# Patient Record
Sex: Female | Born: 2000 | Race: White | Hispanic: No | Marital: Single | Smoking: Never smoker
Health system: Southern US, Community
[De-identification: ages and names within clinical notes are randomized; demographics above are authoritative.]

## PROBLEM LIST (undated history)

## (undated) DIAGNOSIS — Z789 Other specified health status: Secondary | ICD-10-CM

## (undated) HISTORY — DX: Other specified health status: Z78.9

---
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
## 2019-12-13 ENCOUNTER — Encounter (HOSPITAL_COMMUNITY): Payer: Self-pay | Admitting: Emergency Medicine

## 2019-12-13 ENCOUNTER — Other Ambulatory Visit: Payer: Self-pay

## 2019-12-13 ENCOUNTER — Emergency Department (HOSPITAL_COMMUNITY)
Admission: EM | Admit: 2019-12-13 | Discharge: 2019-12-13 | Disposition: A | Discharge status: Home / Self Care | Payer: Self-pay | Attending: Emergency Medicine | Admitting: Emergency Medicine

## 2019-12-13 DIAGNOSIS — Y92410 Unspecified street and highway as the place of occurrence of the external cause: Secondary | ICD-10-CM | POA: Insufficient documentation

## 2019-12-13 DIAGNOSIS — Y999 Unspecified external cause status: Secondary | ICD-10-CM | POA: Insufficient documentation

## 2019-12-13 DIAGNOSIS — M791 Myalgia, unspecified site: Secondary | ICD-10-CM | POA: Insufficient documentation

## 2019-12-13 DIAGNOSIS — M79602 Pain in left arm: Secondary | ICD-10-CM | POA: Insufficient documentation

## 2019-12-13 DIAGNOSIS — Y93I9 Activity, other involving external motion: Secondary | ICD-10-CM | POA: Insufficient documentation

## 2019-12-13 MED ORDER — METHOCARBAMOL 500 MG PO TABS
500.0000 mg | ORAL_TABLET | Freq: Two times a day (BID) | ORAL | 0 refills | Status: DC
Start: 2019-12-13 — End: 2020-03-24
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
## 2019-12-13 NOTE — Discharge Instructions (Signed)

## 2019-12-13 NOTE — ED Notes (Signed)
Discussed discharge instructions and medications with patient.  All questions addressed. Patient transported home via car by her friend.
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
## 2020-03-24 ENCOUNTER — Ambulatory Visit (INDEPENDENT_AMBULATORY_CARE_PROVIDER_SITE_OTHER): Payer: Self-pay | Admitting: *Deleted

## 2020-03-24 ENCOUNTER — Other Ambulatory Visit: Payer: Self-pay

## 2020-03-24 VITALS — BP 120/74 | HR 79 | Temp 98.8°F | Ht 63.0 in | Wt 152.8 lb

## 2020-03-24 DIAGNOSIS — Z3400 Encounter for supervision of normal first pregnancy, unspecified trimester: Secondary | ICD-10-CM | POA: Insufficient documentation

## 2020-03-24 LAB — POCT URINE PREGNANCY: Preg Test, Ur: POSITIVE — AB

## 2020-03-24 NOTE — Progress Notes (Signed)
   PRENATAL INTAKE SUMMARY  Ms. Olheiser presents today New OB Nurse Interview.  OB History    Gravida  1   Para      Term      Preterm      AB      Living        SAB      TAB      Ectopic      Multiple      Live Births             I have reviewed the patient's medical, obstetrical, social, and family histories, medications, and available lab results.  SUBJECTIVE She has no unusual complaints.  OBJECTIVE Initial nurse interview for history (New OB)  EDD: 10/25/20 by LMP GA:[redacted]w[redacted]d G1P0  GENERAL APPEARANCE: alert, well appearing, in no apparent distress, oriented to person, place and time.  ASSESSMENT Positive Pregnancy test Normal pregnancy  PLAN Prenatal care-[facility] to be completed at next visit 04/09/20 with Steward Drone, CNM Continue PNV Patient to sign up for Bab[employer]s Patient to purchase Blood pressure monitor. Offered patient to speak with Behavioral Health Advisor due to PHQ-9 score: 7. Declined services today. Advised patient to call clinic or send Mychart message if she change her mind.  Clovis Pu, RN

## 2020-03-24 NOTE — Patient Instructions (Addendum)
First Trimester of Pregnancy  The first trimester of pregnancy is from week 1 until the end of week 13 (months 1 through 3). During this time, your baby will begin to develop inside you. At 6-8 weeks, the eyes and face are formed, and the heartbeat can be seen on ultrasound. At the end of 12 weeks, all the baby's organs are formed. Prenatal care is all the medical care you receive before the birth of your baby. Make sure you get good prenatal care and follow all of your doctor's instructions. Follow these instructions at home: Medicines  Take over-the-counter and prescription medicines only as told by your doctor. Some medicines are safe and some medicines are not safe during pregnancy.  Take a prenatal vitamin that contains at least 600 micrograms (mcg) of folic acid.  If you have trouble pooping (constipation), take medicine that will make your stool soft (stool softener) if your doctor approves. Eating and drinking   Eat regular, healthy meals.  Your doctor will tell you the amount of weight gain that is right for you.  Avoid raw meat and uncooked cheese.  If you feel sick to your stomach (nauseous) or throw up (vomit): ? Eat 4 or 5 small meals a day instead of 3 large meals. ? Try eating a few soda crackers. ? Drink liquids between meals instead of during meals.  To prevent constipation: ? Eat foods that are high in fiber, like fresh fruits and vegetables, whole grains, and beans. ? Drink enough fluids to keep your pee (urine) clear or pale yellow. Activity  Exercise only as told by your doctor. Stop exercising if you have cramps or pain in your lower belly (abdomen) or low back.  Do not exercise if it is too hot, too humid, or if you are in a place of great height (high altitude).  Try to avoid standing for long periods of time. Move your legs often if you must stand in one place for a long time.  Avoid heavy lifting.  Wear low-heeled shoes. Sit and stand up  straight.  You can have sex unless your doctor tells you not to. Relieving pain and discomfort  Wear a good support bra if your breasts are sore.  Take warm water baths (sitz baths) to soothe pain or discomfort caused by hemorrhoids. Use hemorrhoid cream if your doctor says it is okay.  Rest with your legs raised if you have leg cramps or low back pain.  If you have puffy, bulging veins (varicose veins) in your legs: ? Wear support hose or compression stockings as told by your doctor. ? Raise (elevate) your feet for 15 minutes, 3-4 times a day. ? Limit salt in your food. Prenatal care  Schedule your prenatal visits by the twelfth week of pregnancy.  Write down your questions. Take them to your prenatal visits.  Keep all your prenatal visits as told by your doctor. This is important. Safety  Wear your seat belt at all times when driving.  Make a list of emergency phone numbers. The list should include numbers for family, friends, the hospital, and police and fire departments. General instructions  Ask your doctor for a referral to a local prenatal class. Begin classes no later than at the start of month 6 of your pregnancy.  Ask for help if you need counseling or if you need help with nutrition. Your doctor can give you advice or tell you where to go for help.  Do not use hot tubs, steam   tubs, steam rooms, or saunas.  Do not douche or use tampons or scented sanitary pads.  Do not cross your legs for long periods of time.  Avoid all herbs and alcohol. Avoid drugs that are not approved by your doctor.  Do not use any tobacco products, including cigarettes, chewing tobacco, and electronic cigarettes. If you need help quitting, ask your doctor. You may get counseling or other support to help you quit.  Avoid cat litter boxes and soil used by cats. These carry germs that can cause birth defects in the baby and can cause a loss of your baby (miscarriage) or stillbirth.  Visit your dentist.  At home, brush your teeth with a soft toothbrush. Be gentle when you floss. Contact a doctor if:  You are dizzy.  You have mild cramps or pressure in your lower belly.  You have a nagging pain in your belly area.  You continue to feel sick to your stomach, you throw up, or you have watery poop (diarrhea).  You have a bad smelling fluid coming from your vagina.  You have pain when you pee (urinate).  You have increased puffiness (swelling) in your face, hands, legs, or ankles. Get help right away if:  You have a fever.  You are leaking fluid from your vagina.  You have spotting or bleeding from your vagina.  You have very bad belly cramping or pain.  You gain or lose weight rapidly.  You throw up blood. It may look like coffee grounds.  You are around people who have [place] measles, fifth disease, or chickenpox.  You have a very bad headache.  You have shortness of breath.  You have any kind of trauma, such as from a fall or a car accident. Summary  The first trimester of pregnancy is from week 1 until the end of week 13 (months 1 through 3).  To take care of yourself and your unborn baby, you will need to eat healthy meals, take medicines only if your doctor tells you to do so, and do activities that are safe for you and your baby.  Keep all follow-up visits as told by your doctor. This is important as your doctor will have to ensure that your baby is healthy and growing well. This information is not intended to replace advice given to you by your health care provider. Make sure you discuss any questions you have with your health care provider. Document Revised: 11/02/2018 Document Reviewed: 07/20/2016 Elsevier Patient Education  2020 [employer].  Warning Signs During Pregnancy A pregnancy lasts about 40 weeks, starting from the first day of your last period until the baby is born. Pregnancy is divided into three phases called trimesters.  The first trimester  refers to week 1 through week 13 of pregnancy.  The second trimester is the start of week 14 through the end of week 27.  The third trimester is the start of week 28 until you deliver your baby. During each trimester of pregnancy, certain signs and symptoms may indicate a problem. Talk with your health care provider about your current health and any medical conditions you have. Make sure you know the symptoms that you should watch for and report. How does this affect me?  Warning signs in the first trimester While some changes during the first trimester may be uncomfortable, most do not represent a serious problem. Let your health care provider know if you have any of the following warning signs in the first trimester:  You cannot eat or drink without vomiting, and this lasts for longer than a day.  You have vaginal bleeding or spotting along with menstrual-like cramping.  You have diarrhea for longer than a day.  You have a fever or other signs of infection, such as: ? Pain or burning when you urinate. ? Foul smelling or thick or yellowish vaginal discharge. Warning signs in the second trimester As your baby grows and changes during the second trimester, there are additional signs and symptoms that may indicate a problem. These include:  Signs and symptoms of infection, including a fever.  Signs or symptoms of a miscarriage or preterm labor, such as regular contractions, menstrual-like cramping, or lower abdominal pain.  Bloody or watery vaginal discharge or obvious vaginal bleeding.  Feeling like your heart is pounding.  Having trouble breathing.  Nausea, vomiting, or diarrhea that lasts for longer than a day.  Craving non-food items, such as clay, chalk, or dirt. This may be a sign of a very treatable medical condition called pica. Later in your second trimester, watch for signs and symptoms of a serious medical condition called preeclampsia.These include:  Changes in your  vision.  A severe headache that does not go away.  Nausea and vomiting. It is also important to notice if your baby stops moving or moves less than usual during this time. Warning signs in the third trimester As you approach the third trimester, your baby is growing and your body is preparing for the birth of your baby. In your third trimester, be sure to let your health care provider know if:  You have signs and symptoms of infection, including a fever.  You have vaginal bleeding.  You notice that your baby is moving less than usual or is not moving.  You have nausea, vomiting, or diarrhea that lasts for longer than a day.  You have a severe headache that does not go away.  You have vision changes, including seeing spots or having blurry or double vision.  You have increased swelling in your hands or face. How does this affect my baby? Throughout your pregnancy, always report any of the warning signs of a problem to your health care provider. This can help prevent complications that may affect your baby, including:  Increased risk for premature birth.  Infection that may be transmitted to your baby.  Increased risk for stillbirth. Contact a health care provider if:  You have any of the warning signs of a problem for the current trimester of your pregnancy.  Any of the following apply to you during any trimester of pregnancy: ? You have strong emotions, such as sadness or anxiety, that interfere with work or personal relationships. ? You feel unsafe in your home and need help finding a safe place to live. ? You are using tobacco products, alcohol, or drugs and you need help to stop. Get help right away if: You have signs or symptoms of labor before 37 weeks of pregnancy. These include:  Contractions that are 5 minutes or less apart, or that increase in frequency, intensity, or length.  Sudden, sharp abdominal pain or low back pain.  Uncontrolled gush or trickle of fluid  from your vagina. Summary  A pregnancy lasts about 40 weeks, starting from the first day of your last period until the baby is born. Pregnancy is divided into three phases called trimesters. Each trimester has warning signs to watch for.  Always report any warning signs to your health care provider in  order to prevent complications that may affect both you and your baby.  Talk with your health care provider about your current health and any medical conditions you have. Make sure you know the symptoms that you should watch for and report. This information is not intended to replace advice given to you by your health care provider. Make sure you discuss any questions you have with your health care provider. Document Revised: 10/31/2018 Document Reviewed: 04/28/2017 Elsevier Patient Education  [place].  How to Take Your Blood Pressure You can take your blood pressure at home with a machine. You may need to check your blood pressure at home:  To check if you have high blood pressure (hypertension).  To check your blood pressure over time.  To make sure your blood pressure medicine is working. Supplies needed: You will need a blood pressure machine, or monitor. You can buy one at a drugstore or online. When choosing one:  Choose one with an arm cuff.  Choose one that wraps around your upper arm. Only one finger should fit between your arm and the cuff.  Do not choose one that measures your blood pressure from your wrist or finger. Your doctor can suggest a monitor. How to prepare Avoid these things for 30 minutes before checking your blood pressure:  Drinking caffeine.  Drinking alcohol.  Eating.  Smoking.  Exercising. Five minutes before checking your blood pressure:  Pee.  Sit in a dining chair. Avoid sitting in a soft couch or armchair.  Be quiet. Do not talk. How to take your blood pressure Follow the instructions that came with your machine. If you have a  digital blood pressure monitor, these may be the instructions: 1. Sit up straight. 2. Place your feet on the floor. Do not cross your ankles or legs. 3. Rest your left arm at the level of your heart. You may rest it on a table, desk, or chair. 4. Pull up your shirt sleeve. 5. Wrap the blood pressure cuff around the upper part of your left arm. The cuff should be 1 inch (2.5 cm) above your elbow. It is best to wrap the cuff around bare skin. 6. Fit the cuff snugly around your arm. You should be able to place only one finger between the cuff and your arm. 7. Put the cord inside the groove of your elbow. 8. Press the power button. 9. Sit quietly while the cuff fills with air and loses air. 10. Write down the numbers on the screen. 11. Wait 2-3 minutes and then repeat steps 1-10. What do the numbers mean? Two numbers make up your blood pressure. The first number is called systolic pressure. The second is called diastolic pressure. An example of a blood pressure reading is "120 over 80" (or 120/80). If you are an adult and do not have a medical condition, use this guide to find out if your blood pressure is normal: Normal  First number: below 120.  Second number: below 80. Elevated  First number: 120-129.  Second number: below 80. Hypertension stage 1  First number: 130-139.  Second number: 80-89. Hypertension stage 2  First number: 140 or above.  Second number: 68 or above. Your blood pressure is above normal even if only the top or bottom number is above normal. Follow these instructions at home:  Check your blood pressure as often as your doctor tells you to.  Take your monitor to your next doctor's appointment. Your doctor will: ? Make sure  you are using it correctly. ? Make sure it is working right.  Make sure you understand what your blood pressure numbers should be.  Tell your doctor if your medicines are causing side effects. Contact a doctor if:  Your blood  pressure keeps being high. Get help right away if:  Your first blood pressure number is higher than 180.  Your second blood pressure number is higher than 120. This information is not intended to replace advice given to you by your health care provider. Make sure you discuss any questions you have with your health care provider. Document Revised: 06/24/2017 Document Reviewed: 12/19/2015 Elsevier Patient Education  2020 [employer] Inc[employer]  

## 2020-04-09 ENCOUNTER — Ambulatory Visit (INDEPENDENT_AMBULATORY_CARE_PROVIDER_SITE_OTHER): Payer: Self-pay | Admitting: Certified Nurse Midwife

## 2020-04-09 ENCOUNTER — Encounter: Payer: Self-pay | Admitting: Certified Nurse Midwife

## 2020-04-09 ENCOUNTER — Other Ambulatory Visit (HOSPITAL_COMMUNITY)
Admission: RE | Admit: 2020-04-09 | Discharge: 2020-04-09 | Disposition: A | Discharge status: Home / Self Care | Payer: Self-pay | Source: Ambulatory Visit | Attending: Certified Nurse Midwife | Admitting: Certified Nurse Midwife

## 2020-04-09 ENCOUNTER — Other Ambulatory Visit: Payer: Self-pay

## 2020-04-09 VITALS — BP 111/64 | HR 72 | Wt 154.0 lb

## 2020-04-09 DIAGNOSIS — Z3400 Encounter for supervision of normal first pregnancy, unspecified trimester: Secondary | ICD-10-CM

## 2020-04-09 DIAGNOSIS — N760 Acute vaginitis: Secondary | ICD-10-CM

## 2020-04-09 DIAGNOSIS — O219 Vomiting of pregnancy, unspecified: Secondary | ICD-10-CM

## 2020-04-09 DIAGNOSIS — O26891 Other specified pregnancy related conditions, first trimester: Secondary | ICD-10-CM

## 2020-04-09 DIAGNOSIS — N898 Other specified noninflammatory disorders of vagina: Secondary | ICD-10-CM | POA: Insufficient documentation

## 2020-04-09 DIAGNOSIS — B9689 Other specified bacterial agents as the cause of diseases classified elsewhere: Secondary | ICD-10-CM

## 2020-04-09 DIAGNOSIS — Z3A11 11 weeks gestation of pregnancy: Secondary | ICD-10-CM

## 2020-04-09 NOTE — Progress Notes (Signed)
History:   Norma Rodriguez is a 19 y.o. G1P0 at [redacted]w[redacted]d by LMP being seen today for her first obstetrical visit.  Her obstetrical history is significant for nothing. Patient does not intend to breast feed. Pregnancy history fully reviewed.  Patient reports nausea and vomiting.     HISTORY: OB History  Gravida Para Term Preterm AB Living  1 0 0 0 0 0  SAB TAB Ectopic Multiple Live Births  0 0 0 0 0    # Outcome Date GA Lbr Len/2nd Weight Sex Delivery Anes PTL Lv  1 Current             No pap needed, patient is <21yo.   History reviewed. No pertinent past medical history. History reviewed. No pertinent surgical history. Family History  Problem Relation Age of Onset  . Diabetes Mother    Social History   Tobacco Use  . Smoking status: Never Smoker  . Smokeless tobacco: Never Used  Vaping Use  . Vaping Use: Never used  Substance Use Topics  . Alcohol use: Never  . Drug use: Not Currently    Types: Marijuana   No Known Allergies No current outpatient medications on file prior to visit.   No current facility-administered medications on file prior to visit.    Review of Systems Pertinent items noted in HPI and remainder of comprehensive ROS otherwise negative. Physical Exam:   Vitals:   04/09/20 1411  BP: 111/64  Pulse: 72  Weight: 154 lb (69.9 kg)   Fetal Heart Rate (bpm): 167  System: General: well-developed, well-nourished female in no acute distress   Skin: normal coloration and turgor, no rashes   Neurologic: oriented, normal, negative, normal mood   Extremities: normal strength, tone, and muscle mass, ROM of all joints is normal   HEENT PERRLA, extraocular movement intact and sclera clear   Mouth/Teeth mucous membranes moist, pharynx normal without lesions and dental hygiene good   Neck supple and no masses   Cardiovascular: regular rate and rhythm   Respiratory:  no respiratory distress, normal breath sounds   Abdomen: soft, non-tender; bowel sounds  normal; no masses,  no organomegaly    Assessment:    Pregnancy: G1P0 Patient Active Problem List   Diagnosis Date Noted  . Supervision of normal first pregnancy, antepartum 03/24/2020     Plan:    1. Supervision of normal first pregnancy, antepartum - welcomed to practice and introduced self to patient  - congratulated on pregnancy  - Reviewed safety, visitor policy, reassurance about COVID-19 for pregnancy at this time. Discussed possible changes to visits, including televisits, that may occur due to COVID-19.  The office remains open if pt needs to be seen and MAU is open 24 hours/day for OB emergencies. - Routine prenatal care - anticipatory guidance on upcoming appointments - patient is self pay currently but plans to get pregnancy medicaid  - patient reports having a maternal fam hx of diabetes, A1c ordered - Culture, OB Urine - CBC/D/Plt+RPR+Rh+ABO+Rub Ab... - [place] MFM OB COMP + 14 WK; Future - HgB A1c - Cervicovaginal ancillary only( [place])  2. [redacted] weeks gestation of pregnancy  3. Nausea and vomiting in pregnancy - patient reports occasional morning sickness but feels better after she eats  4. Vaginal discharge during pregnancy in first trimester - patient reports white thin discharge without odor, denies irritation or itching  - swabs collected and will manage according based on results  - Cervicovaginal ancillary only( [place])  Initial labs drawn. Continue prenatal vitamins. Problem list reviewed and updated. Genetic Screening discussed, NIPS: plans to get after insurance approved. Ultrasound discussed; fetal anatomic survey: ordered. Anticipatory guidance about prenatal visits given including labs, ultrasounds, and testing. Discussed usage of Babyscripts and virtual visits as additional source of managing and completing prenatal visits in midst of coronavirus and pandemic.   Encouraged to complete MyChart Registration for her ability to review  results, send requests, and have questions addressed.  The nature of [place] [facility] Practice with multiple MDs and Advanced Practice Providers was explained to patient; also emphasized that residents, students are part of our team. Routine obstetric precautions reviewed. Encouraged to seek out care at office or emergency room [facility]U preferred) for urgent and/or emergent concerns. Return in about 4 weeks (around 05/07/2020) for ROB-mychart.      Sharyon Cable, CNM [facility] for [employer], [facility]

## 2020-04-09 NOTE — Patient Instructions (Signed)
First Trimester of Pregnancy  The first trimester of pregnancy is from week 1 until the end of week 13 (months 1 through 3). During this time, your baby will begin to develop inside you. At 6-8 weeks, the eyes and face are formed, and the heartbeat can be seen on ultrasound. At the end of 12 weeks, all the baby's organs are formed. Prenatal care is all the medical care you receive before the birth of your baby. Make sure you get good prenatal care and follow all of your doctor's instructions. Follow these instructions at home: Medicines  Take over-the-counter and prescription medicines only as told by your doctor. Some medicines are safe and some medicines are not safe during pregnancy.  Take a prenatal vitamin that contains at least 600 micrograms (mcg) of folic acid.  If you have trouble pooping (constipation), take medicine that will make your stool soft (stool softener) if your doctor approves. Eating and drinking   Eat regular, healthy meals.  Your doctor will tell you the amount of weight gain that is right for you.  Avoid raw meat and uncooked cheese.  If you feel sick to your stomach (nauseous) or throw up (vomit): ? Eat 4 or 5 small meals a day instead of 3 large meals. ? Try eating a few soda crackers. ? Drink liquids between meals instead of during meals.  To prevent constipation: ? Eat foods that are high in fiber, like fresh fruits and vegetables, whole grains, and beans. ? Drink enough fluids to keep your pee (urine) clear or pale yellow. Activity  Exercise only as told by your doctor. Stop exercising if you have cramps or pain in your lower belly (abdomen) or low back.  Do not exercise if it is too hot, too humid, or if you are in a place of great height (high altitude).  Try to avoid standing for long periods of time. Move your legs often if you must stand in one place for a long time.  Avoid heavy lifting.  Wear low-heeled shoes. Sit and stand up  straight.  You can have sex unless your doctor tells you not to. Relieving pain and discomfort  Wear a good support bra if your breasts are sore.  Take warm water baths (sitz baths) to soothe pain or discomfort caused by hemorrhoids. Use hemorrhoid cream if your doctor says it is okay.  Rest with your legs raised if you have leg cramps or low back pain.  If you have puffy, bulging veins (varicose veins) in your legs: ? Wear support hose or compression stockings as told by your doctor. ? Raise (elevate) your feet for 15 minutes, 3-4 times a day. ? Limit salt in your food. Prenatal care  Schedule your prenatal visits by the twelfth week of pregnancy.  Write down your questions. Take them to your prenatal visits.  Keep all your prenatal visits as told by your doctor. This is important. Safety  Wear your seat belt at all times when driving.  Make a list of emergency phone numbers. The list should include numbers for family, friends, the hospital, and police and fire departments. General instructions  Ask your doctor for a referral to a local prenatal class. Begin classes no later than at the start of month 6 of your pregnancy.  Ask for help if you need counseling or if you need help with nutrition. Your doctor can give you advice or tell you where to go for help.  Do not use hot tubs, steam   rooms, or saunas.  Do not douche or use tampons or scented sanitary pads.  Do not cross your legs for long periods of time.  Avoid all herbs and alcohol. Avoid drugs that are not approved by your doctor.  Do not use any tobacco products, including cigarettes, chewing tobacco, and electronic cigarettes. If you need help quitting, ask your doctor. You may get counseling or other support to help you quit.  Avoid cat litter boxes and soil used by cats. These carry germs that can cause birth defects in the baby and can cause a loss of your baby (miscarriage) or stillbirth.  Visit your dentist.  At home, brush your teeth with a soft toothbrush. Be gentle when you floss. Contact a doctor if:  You are dizzy.  You have mild cramps or pressure in your lower belly.  You have a nagging pain in your belly area.  You continue to feel sick to your stomach, you throw up, or you have watery poop (diarrhea).  You have a bad smelling fluid coming from your vagina.  You have pain when you pee (urinate).  You have increased puffiness (swelling) in your face, hands, legs, or ankles. Get help right away if:  You have a fever.  You are leaking fluid from your vagina.  You have spotting or bleeding from your vagina.  You have very bad belly cramping or pain.  You gain or lose weight rapidly.  You throw up blood. It may look like coffee grounds.  You are around people who have German measles, fifth disease, or chickenpox.  You have a very bad headache.  You have shortness of breath.  You have any kind of trauma, such as from a fall or a car accident. Summary  The first trimester of pregnancy is from week 1 until the end of week 13 (months 1 through 3).  To take care of yourself and your unborn baby, you will need to eat healthy meals, take medicines only if your doctor tells you to do so, and do activities that are safe for you and your baby.  Keep all follow-up visits as told by your doctor. This is important as your doctor will have to ensure that your baby is healthy and growing well. This information is not intended to replace advice given to you by your health care provider. Make sure you discuss any questions you have with your health care provider. Document Revised: 11/02/2018 Document Reviewed: 07/20/2016 Elsevier Patient Education  2020 [employer]  Safe Medications in Pregnancy   Acne: Benzoyl Peroxide Salicylic Acid  Backache/Headache: Tylenol: 2 regular strength every 4 hours OR              2 Extra strength every 6  hours  Colds/Coughs/Allergies: Benadryl (alcohol free) 25 mg every 6 hours as needed Breath right strips Claritin Cepacol throat lozenges Chloraseptic throat spray Cold-Eeze- up to three times per day Cough drops, alcohol free Flonase (by prescription only) Guaifenesin Mucinex Robitussin DM (plain only, alcohol free) Saline nasal spray/drops Sudafed (pseudoephedrine) & Actifed ** use only after [redacted] weeks gestation and if you do not have high blood pressure Tylenol Vicks Vaporub Zinc lozenges Zyrtec   Constipation: Colace Ducolax suppositories Fleet enema Glycerin suppositories Metamucil Milk of magnesia Miralax Senokot Smooth move tea  Diarrhea: Kaopectate Imodium A-D  *NO pepto Bismol  Hemorrhoids: Anusol Anusol HC Preparation H Tucks  Indigestion: Tums Maalox Mylanta Zantac  Pepcid  Insomnia: Benadryl (alcohol free) 25mg every 6 hours as needed   Tylenol PM Unisom, no Gelcaps  Leg Cramps: Tums MagGel  Nausea/Vomiting:  Bonine Dramamine Emetrol Ginger extract Sea bands Meclizine  Nausea medication to take during pregnancy:  Unisom (doxylamine succinate 25 mg tablets) Take one tablet daily at bedtime. If symptoms are not adequately controlled, the dose can be increased to a maximum recommended dose of two tablets daily (1/2 tablet in the morning, 1/2 tablet mid-afternoon and one at bedtime). Vitamin B6 100mg tablets. Take one tablet twice a day (up to 200 mg per day).  Skin Rashes: Aveeno products Benadryl cream or 25mg every 6 hours as needed Calamine Lotion 1% cortisone cream  Yeast infection: Gyne-lotrimin 7 Monistat 7   **If taking multiple medications, please check labels to avoid duplicating the same active ingredients **take medication as directed on the label ** Do not exceed 4000 mg of tylenol in 24 hours **Do not take medications that contain aspirin or ibuprofen   

## 2020-04-09 NOTE — Progress Notes (Signed)
Self Pay

## 2020-04-10 LAB — CBC/D/PLT+RPR+RH+ABO+RUB AB...
Antibody Screen: NEGATIVE
Basophils Absolute: 0 10*3/uL (ref 0.0–0.2)
Basos: 1 %
EOS (ABSOLUTE): 0.1 10*3/uL (ref 0.0–0.4)
Eos: 2 %
HCV Ab: <0.1 s/co ratio (ref 0.0–0.9)
HIV Screen 4th Generation wRfx: NONREACTIVE
Hematocrit: 35.2 % (ref 34.0–46.6)
Hemoglobin: 12.4 g/dL (ref 11.1–15.9)
Hepatitis B Surface Ag: NEGATIVE
Immature Grans (Abs): 0 10*3/uL (ref 0.0–0.1)
Immature Granulocytes: 0 %
Lymphocytes Absolute: 1.6 10*3/uL (ref 0.7–3.1)
Lymphs: 25 %
MCH: 30.6 pg (ref 26.6–33.0)
MCHC: 35.2 g/dL (ref 31.5–35.7)
MCV: 87 fL (ref 79–97)
Monocytes Absolute: 0.5 10*3/uL (ref 0.1–0.9)
Monocytes: 8 %
Neutrophils Absolute: 4 10*3/uL (ref 1.4–7.0)
Neutrophils: 64 %
Platelets: 291 10*3/uL (ref 150–450)
RBC: 4.05 x10E6/uL (ref 3.77–5.28)
RDW: 12.6 % (ref 11.7–15.4)
RPR Ser Ql: NONREACTIVE
Rh Factor: POSITIVE
Rubella Antibodies, IGG: 2.46 index (ref >0.99)
WBC: 6.2 10*3/uL (ref 3.4–10.8)

## 2020-04-10 LAB — HEMOGLOBIN A1C
Est. average glucose Bld gHb Est-mCnc: 103 mg/dL
Hgb A1c MFr Bld: 5.2 % (ref 4.8–5.6)

## 2020-04-10 LAB — HCV INTERPRETATION

## 2020-04-11 LAB — CULTURE, OB URINE

## 2020-04-11 LAB — URINE CULTURE, OB REFLEX

## 2020-04-14 LAB — CERVICOVAGINAL ANCILLARY ONLY
Bacterial Vaginitis (gardnerella): POSITIVE — AB
Candida Glabrata: NEGATIVE
Candida Vaginitis: NEGATIVE
Chlamydia: NEGATIVE
Comment: NEGATIVE
Comment: NEGATIVE
Comment: NEGATIVE
Comment: NEGATIVE
Comment: NEGATIVE
Comment: NORMAL
Neisseria Gonorrhea: NEGATIVE
Trichomonas: NEGATIVE

## 2020-04-14 MED ORDER — METRONIDAZOLE 500 MG PO TABS: 500.0000 mg | ORAL_TABLET | Freq: Two times a day (BID) | ORAL | 0 refills | Status: DC

## 2020-04-14 NOTE — Addendum Note (Signed)
Addended by: Sharyon Cable on: 04/14/2020 05:15 PM   Modules accepted: Orders

## 2020-05-01 MED ORDER — TERCONAZOLE 0.4 % VA CREA
1.0000 | TOPICAL_CREAM | Freq: Every day | VAGINAL | 0 refills | Status: DC
Start: 2020-05-01 — End: 2020-06-11

## 2020-05-05 ENCOUNTER — Other Ambulatory Visit: Payer: Self-pay | Admitting: *Deleted

## 2020-05-05 DIAGNOSIS — B9689 Other specified bacterial agents as the cause of diseases classified elsewhere: Secondary | ICD-10-CM

## 2020-05-05 MED ORDER — METRONIDAZOLE 0.75 % VA GEL: 1.0000 | Freq: Every day | VAGINAL | 0 refills | Status: AC

## 2020-05-05 NOTE — Progress Notes (Signed)
Patient called stating she can not keep the metronidazole tablets down. Patient requested in person for Thursday. Advised patient she can try using the vaginal cream instead of taking the tablets. Metrogel vaginal cream sent to pharmacy 1 application at bedtime x 5 days.  Clovis Pu, RN

## 2020-05-08 ENCOUNTER — Encounter: Payer: Self-pay | Admitting: Obstetrics and Gynecology

## 2020-05-21 ENCOUNTER — Encounter: Payer: Self-pay | Admitting: General Practice

## 2020-05-21 ENCOUNTER — Other Ambulatory Visit: Payer: Self-pay

## 2020-05-21 ENCOUNTER — Ambulatory Visit (INDEPENDENT_AMBULATORY_CARE_PROVIDER_SITE_OTHER): Payer: Medicaid Other | Admitting: Certified Nurse Midwife

## 2020-05-21 VITALS — BP 118/73 | HR 84 | Temp 98.0°F | Wt 157.2 lb

## 2020-05-21 DIAGNOSIS — Z3A17 17 weeks gestation of pregnancy: Secondary | ICD-10-CM

## 2020-05-21 DIAGNOSIS — Z3400 Encounter for supervision of normal first pregnancy, unspecified trimester: Secondary | ICD-10-CM

## 2020-05-21 NOTE — Progress Notes (Signed)
   PRENATAL VISIT NOTE  Subjective:  Norma Rodriguez is a 19 y.o. G1P0 at [redacted]w[redacted]d being seen today for ongoing prenatal care.  She is currently monitored for the following issues for this low-risk pregnancy and has Supervision of normal first pregnancy, antepartum on their problem list.  Patient reports no complaints.  Contractions: Not present. Vag. Bleeding: None.  Movement: Absent. Denies leaking of fluid.   The following portions of the patient's history were reviewed and updated as appropriate: allergies, current medications, past family history, past medical history, past social history, past surgical history and problem list.   Objective:   Vitals:   05/21/20 0953  BP: 118/73  Pulse: 84  Temp: 98 F (36.7 C)  Weight: 157 lb 3.2 oz (71.3 kg)    Fetal Status: Fetal Heart Rate (bpm): 165 Fundal Height: 17 cm Movement: Absent     General:  Alert, oriented and cooperative. Patient is in no acute distress.  Skin: Skin is warm and dry. No rash noted.   Cardiovascular: Normal heart rate noted  Respiratory: Normal respiratory effort, no problems with respiration noted  Abdomen: Soft, gravid, appropriate for gestational age.  Pain/Pressure: Present     Pelvic: Cervical exam deferred        Extremities: Normal range of motion.  Edema: None  Mental Status: Normal mood and affect. Normal behavior. Normal judgment and thought content.   Assessment and Plan:  Pregnancy: G1P0 at [redacted]w[redacted]d 1. Supervision of normal first pregnancy, antepartum - Pt doing well, no complaints  - Reviewed foods at high risk for foodborne illness in pregnancy. Encouraged her to always wash lettuce/greens, avoid precut fruits/veggies/unpasteurized foods, and heat up deli meats.  2. [redacted] weeks gestation of pregnancy - Genetic Screening - AFP, Serum, Open Spina Bifida  Preterm labor symptoms and general obstetric precautions including but not limited to vaginal bleeding, contractions, leaking of fluid and fetal movement  were reviewed in detail with the patient. Please refer to After Visit Summary for other counseling recommendations.   Return in about 3 weeks (around 06/11/2020) for VIRTUAL, LOB.  Future Appointments  Date Time Provider Department Center  06/04/2020  9:45 AM [facility]  06/11/2020 11:10 AM Bernerd Limbo, CNM [facility] None   Edd Arbour, CNM, MSN, [facility] 05/21/20 11:38 AM

## 2020-05-23 LAB — AFP, SERUM, OPEN SPINA BIFIDA
AFP MoM: 0.87
AFP Value: 32.6 ng/mL
Gest. Age on Collection Date: 17 weeks
Maternal Age At EDD: 20.6 yr
OSBR Risk 1 IN: 10000
Test Results:: NEGATIVE
Weight: 157 [lb_av]

## 2020-05-26 ENCOUNTER — Telehealth: Payer: Self-pay | Admitting: *Deleted

## 2020-05-26 NOTE — Telephone Encounter (Signed)
Patient called reporting lower back pain. She has tried heating pad and hot shower, denied using Tylenol. Advised patient to try taking extra strength Tylenol and heating pad. If pain worsen to call the office for an appointment.  Clovis Pu, RN

## 2020-05-27 ENCOUNTER — Encounter: Payer: Self-pay | Admitting: General Practice

## 2020-06-03 ENCOUNTER — Encounter: Payer: Self-pay | Admitting: General Practice

## 2020-06-04 ENCOUNTER — Ambulatory Visit: Payer: Medicaid Other | Attending: Certified Nurse Midwife

## 2020-06-04 ENCOUNTER — Other Ambulatory Visit: Payer: Self-pay | Admitting: *Deleted

## 2020-06-04 ENCOUNTER — Other Ambulatory Visit: Payer: Self-pay

## 2020-06-04 DIAGNOSIS — Z362 Encounter for other antenatal screening follow-up: Secondary | ICD-10-CM

## 2020-06-04 DIAGNOSIS — Z3A19 19 weeks gestation of pregnancy: Secondary | ICD-10-CM

## 2020-06-04 DIAGNOSIS — Z363 Encounter for antenatal screening for malformations: Secondary | ICD-10-CM | POA: Diagnosis not present

## 2020-06-04 DIAGNOSIS — Z3400 Encounter for supervision of normal first pregnancy, unspecified trimester: Secondary | ICD-10-CM | POA: Insufficient documentation

## 2020-06-11 ENCOUNTER — Telehealth (INDEPENDENT_AMBULATORY_CARE_PROVIDER_SITE_OTHER): Payer: Medicaid Other | Admitting: Certified Nurse Midwife

## 2020-06-11 VITALS — BP 128/73 | HR 79 | Wt 159.8 lb

## 2020-06-11 DIAGNOSIS — Z3A20 20 weeks gestation of pregnancy: Secondary | ICD-10-CM

## 2020-06-11 DIAGNOSIS — Z3402 Encounter for supervision of normal first pregnancy, second trimester: Secondary | ICD-10-CM

## 2020-06-11 DIAGNOSIS — Z3400 Encounter for supervision of normal first pregnancy, unspecified trimester: Secondary | ICD-10-CM

## 2020-06-11 NOTE — Progress Notes (Signed)
OBSTETRICS PRENATAL VIRTUAL VISIT ENCOUNTER NOTE  Provider location: [facility]   I connected with Norma Rodriguez on 06/11/20 at 11:10 AM EST by MyChart Video Encounter at home and verified that I am speaking with the correct person using two identifiers.   I discussed the limitations, risks, security and privacy concerns of performing an evaluation and management service virtually and the availability of in person appointments. I also discussed with the patient that there may be a patient responsible charge related to this service. The patient expressed understanding and agreed to proceed. Subjective:  Norma Rodriguez is a 19 y.o. G1P0 at [redacted]w[redacted]d being seen today for ongoing prenatal care.  She is currently monitored for the following issues for this low-risk pregnancy and has Supervision of normal first pregnancy, antepartum on their problem list.  Patient reports intermittent back ache on the lower right side. She said it was more severe, did not ease with Tylenol and heat, it just eventually got better. She has not had any back pain in the last few days.  Contractions: Not present. Vag. Bleeding: None.  Movement: Present. Denies any leaking of fluid.   The following portions of the patient's history were reviewed and updated as appropriate: allergies, current medications, past family history, past medical history, past social history, past surgical history and problem list.   Objective:   Vitals:   06/11/20 1109  BP: 128/73  Pulse: 79  Weight: 159 lb 12.8 oz (72.5 kg)    Fetal Status:     Movement: Present     General:  Alert, oriented and cooperative. Patient is in no acute distress.  Respiratory: Normal respiratory effort, no problems with respiration noted  Mental Status: Normal mood and affect. Normal behavior. Normal judgment and thought content.  Rest of physical exam deferred due to type of encounter  Imaging: [place] MFM OB COMP + 14 WK  Result  Date: 06/04/2020 ----------------------------------------------------------------------  OBSTETRICS REPORT                       (Signed Final 06/04/2020 02:20 pm) ---------------------------------------------------------------------- Patient Info  ID #:       [id number]                          D.O.B.:  06/23/2001 (19 yrs)  Name:       Norma Rodriguez                   Visit Date: 06/04/2020 09:43 am ---------------------------------------------------------------------- Performed By  Attending:        Lin Landsman      Referred By:      [facility]                    MD  Performed By:     Earley Brooke     [facility] ---------------------------------------------------------------------- Orders  #  Description                           Code        Ordered By  1  [place] MFM OB COMP + 14 WK                [id number]    VERONICA ROGERS ----------------------------------------------------------------------  #  Order #                     Accession #                Episode #  1  [id number]                   [id number]                 [id number] ---------------------------------------------------------------------- Indications  Encounter for antenatal screening for          Z36.3  malformations  [redacted] weeks gestation of pregnancy                Z3A.19  LR NIPS   Neg Horizon  Neg AFP ---------------------------------------------------------------------- Fetal Evaluation  Num Of Fetuses:         1  Fetal Heart Rate(bpm):  154  Cardiac Activity:       Observed  Presentation:           Cephalic  Placenta:               Anterior Left lateral  P. Cord Insertion:      Visualized  Amniotic Fluid  AFI FV:      Within normal limits                              Largest Pocket(cm)                               4.49 ---------------------------------------------------------------------- Biometry  BPD:      48.4  mm     G. Age:  20w 4d         88  %    CI:        78.57   %    70 - 86                                                          FL/HC:      18.8   %    16.8 - 19.8  HC:      172.7  mm     G. Age:  19w 6d         55  %    HC/AC:      1.17        1.09 - 1.39  AC:      147.2  mm     G. Age:  20w 0d         60  %    FL/BPD:     66.9   %  FL:       32.4  mm     G. Age:  20w 1d         62  %    FL/AC:      22.0   %    20 - 24  HUM:      30.6  mm     G. Age:  20w 1d         66  %  CER:      21.3  mm     G. Age:  20w 1d         84  %  NFT:       4.8  mm  LV:        7.2  mm  CM:        3.8  mm  Est. FW:     329  gm    0 lb 12 oz      73  % ---------------------------------------------------------------------- OB History  Gravidity:    1         Term:   0        Prem:   0        SAB:   0  TOP:          0       Ectopic:  0        Living: 0 ---------------------------------------------------------------------- Gestational Age  LMP:           19w 4d        Date:  01/19/20                 EDD:   10/25/20  U/S Today:     20w 1d                                        EDD:   10/21/20  Best:          19w 4d     Det. By:  LMP  (01/19/20)          EDD:   10/25/20 ---------------------------------------------------------------------- Anatomy  Cranium:               Appears normal         LVOT:                   Appears normal  Cavum:                 Appears normal         Aortic Arch:            Appears normal  Ventricles:            Appears normal         Ductal Arch:            Not well visualized  Choroid Plexus:        Appears normal         Diaphragm:              Appears normal  Cerebellum:            Appears  normal         Stomach:                Appears normal, left                                                                        sided  Posterior Fossa:       Appears normal         Abdomen:                 Appears normal  Nuchal Fold:           Appears normal         Abdominal Wall:         Appears nml (cord                                                                        insert, abd wall)  Face:                  Orbits appear          Cord Vessels:           Appears normal (3                         normal                                         vessel cord)  Lips:                  Appears normal         Kidneys:                Appear normal  Palate:                Not well visualized    Bladder:                Appears normal  Thoracic:              Appears normal         Spine:                  Appears normal  Heart:                 Not well visualized    Upper Extremities:      Appears normal  RVOT:                  Appears normal         Lower Extremities:      Appears normal  Other:  Fetus appears to be a female. Heels and 5th digit visualized. Open          hands visualized. Nasal bone visualized. ---------------------------------------------------------------------- Cervix Uterus Adnexa  Cervix  Length:           3.53  cm.  Normal appearance by transabdominal scan.  Right Ovary  Within normal limits.  Left Ovary  Within normal limits.  Adnexa  No abnormality visualized. ---------------------------------------------------------------------- Impression  Single intrauterine pregnancy here for a detailed anatomy  Normal anatomy with measurements consistent with dates  There is good fetal movement and amniotic fluid volume  Suboptimal views of the fetal anatomy were obtained  secondary to fetal position. ---------------------------------------------------------------------- Recommendations  Follow up growth in 4 weeks. ----------------------------------------------------------------------               Lin Landsman, MD Electronically Signed Final Report   06/04/2020 02:20 pm ----------------------------------------------------------------------   Assessment and Plan:  Pregnancy: G1P0 at [redacted]w[redacted]d 1.  Supervision of normal first pregnancy, antepartum - Pt doing well, was concerned about her back pain. Given education on common reasons for back pain in pregnancy and encouraged daily stretching/walking to prevent. Also discussed possibility of possible hydronephrosis since her pain is on the right side only.  - Requested she come in for a UA if the back pain returns and does not subside with Tylenol/heating pad - Encouraged increased hydration  2. [redacted] weeks gestation of pregnancy - Next visit in person with UA - Reviewed results of U/S and explained how the anterior placenta can alter the perception of fetal movements. She is starting to feel daily movements.  Preterm labor symptoms and general obstetric precautions including but not limited to vaginal bleeding, contractions, leaking of fluid and fetal movement were reviewed in detail with the patient. I discussed the assessment and treatment plan with the patient. The patient was provided an opportunity to ask questions and all were answered. The patient agreed with the plan and demonstrated an understanding of the instructions. The patient was advised to call back or seek an in-person office evaluation/go to [facility] at [facility] for any urgent or concerning symptoms. Please refer to After Visit Summary for other counseling recommendations.   I provided 12 minutes of face-to-face time during this encounter.  Return in about 4 weeks (around 07/09/2020) for LOB, IN-PERSON.  Future Appointments  Date Time Provider Department Center  07/02/2020  3:30 PM [facility] NURSE [facility]  07/02/2020  3:45 PM [facility]    Bernerd Limbo, CNM [facility] [employer], [facility]

## 2020-06-11 NOTE — Patient Instructions (Signed)
Back Pain in Pregnancy Back pain during pregnancy is common. Back pain may be caused by several factors that are related to changes during your pregnancy. Follow these instructions at home: Managing pain, stiffness, and swelling      If directed, for sudden (acute) back pain, put ice on the painful area. ? Put ice in a plastic bag. ? Place a towel between your skin and the bag. ? Leave the ice on for 20 minutes, 2-3 times per day.  If directed, apply heat to the affected area before you exercise. Use the heat source that your health care provider recommends, such as a moist heat pack or a heating pad. ? Place a towel between your skin and the heat source. ? Leave the heat on for 20-30 minutes. ? Remove the heat if your skin turns bright red. This is especially important if you are unable to feel pain, heat, or cold. You may have a greater risk of getting burned.  If directed, massage the affected area. Activity  Exercise as told by your health care provider. Gentle exercise is the best way to prevent or manage back pain.  Listen to your body when lifting. If lifting hurts, ask for help or bend your knees. This uses your leg muscles instead of your back muscles.  Squat down when picking up something from the floor. Do not bend over.  Only use bed rest for short periods as told by your health care provider. Bed rest should only be used for the most severe episodes of back pain. Standing, sitting, and lying down  Do not stand in one place for long periods of time.  Use good posture when sitting. Make sure your head rests over your shoulders and is not hanging forward. Use a pillow on your lower back if necessary.  Try sleeping on your side, preferably the left side, with a pregnancy support pillow or 1-2 regular pillows between your legs. ? If you have back pain after a night's rest, your bed may be too soft. ? A firm mattress may provide more support for your back during  pregnancy. General instructions  Do not wear high heels.  Eat a healthy diet. Try to gain weight within your health care provider's recommendations.  Use a maternity girdle, elastic sling, or back brace as told by your health care provider.  Take over-the-counter and prescription medicines only as told by your health care provider.  Work with a physical therapist or massage therapist to find ways to manage back pain. Acupuncture or massage therapy may be helpful.  Keep all follow-up visits as told by your health care provider. This is important. Contact a health care provider if:  Your back pain interferes with your daily activities.  You have increasing pain in other parts of your body. Get help right away if:  You develop numbness, tingling, weakness, or problems with the use of your arms or legs.  You develop severe back pain that is not controlled with medicine.  You have a change in bowel or bladder control.  You develop shortness of breath, dizziness, or you faint.  You develop nausea, vomiting, or sweating.  You have back pain that is a rhythmic, cramping pain similar to labor pains. Labor pain is usually 1-2 minutes apart, lasts for about 1 minute, and involves a bearing down feeling or pressure in your pelvis.  You have back pain and your water breaks or you have vaginal bleeding.  You have back pain or numbness  that travels down your leg.  Your back pain developed after you fell.  You develop pain on one side of your back.  You see blood in your urine.  You develop skin blisters in the area of your back pain. Summary  Back pain may be caused by several factors that are related to changes during your pregnancy.  Follow instructions as told by your health care provider for managing pain, stiffness, and swelling.  Exercise as told by your health care provider. Gentle exercise is the best way to prevent or manage back pain.  Take over-the-counter and  prescription medicines only as told by your health care provider.  Keep all follow-up visits as told by your health care provider. This is important. This information is not intended to replace advice given to you by your health care provider. Make sure you discuss any questions you have with your health care provider. Document Revised: 10/31/2018 Document Reviewed: 12/28/2017 Elsevier Patient Education  2020 Elsevier Inc.   Pregnancy and Urinary Tract Infection  A urinary tract infection (UTI) is an infection of any part of the urinary tract. This includes the kidneys, the tubes that connect your kidneys to your bladder (ureters), the bladder, and the tube that carries urine out of your body (urethra). These organs make, store, and get rid of urine in the body. Your health care provider may use other names to describe the infection. An upper UTI affects the ureters and kidneys (pyelonephritis). A lower UTI affects the bladder (cystitis) and urethra (urethritis). Most urinary tract infections are caused by bacteria in your genital area, around the entrance to your urinary tract (urethra). These bacteria grow and cause irritation and inflammation of your urinary tract. You are more likely to develop a UTI during pregnancy because the physical and hormonal changes your body goes through can make it easier for bacteria to get into your urinary tract. Your growing baby also puts pressure on your bladder and can affect urine flow. It is important to recognize and treat UTIs in pregnancy because of the risk of serious complications for both you and your baby. How does this affect me? Symptoms of a UTI include:  Needing to urinate right away (urgently).  Frequent urination or passing small amounts of urine frequently.  Pain or burning with urination.  Blood in the urine.  Urine that smells bad or unusual.  Trouble urinating.  Cloudy urine.  Pain in the abdomen or lower back.  Vaginal  discharge. You may also have:  Vomiting or a decreased appetite.  Confusion.  Irritability or tiredness.  A fever.  Diarrhea. How does this affect my baby? An untreated UTI during pregnancy could lead to a kidney infection or a systemic infection, which can cause health problems that could affect your baby. Possible complications of an untreated UTI include:  Giving birth to your baby before 37 weeks of pregnancy (premature).  Having a baby with a low birth weight.  Developing high blood pressure during pregnancy (preeclampsia).  Having a low hemoglobin level (anemia). What can I do to lower my risk? To prevent a UTI:  Go to the bathroom as soon as you feel the need. Do not hold urine for long periods of time.  Always wipe from front to back, especially after a bowel movement. Use each tissue one time when you wipe.  Empty your bladder after sex.  Keep your genital area dry.  Drink 6-10 glasses of water each day.  Do not douche or use  How is this treated? Treatment for this condition may include:  Antibiotic medicines that are safe to take during pregnancy.  Other medicines to treat less common causes of UTI. Follow these instructions at home:  If you were prescribed an antibiotic medicine, take it as told by your health care provider. Do not stop using the antibiotic even if you start to feel better.  Keep all follow-up visits as told by your health care provider. This is important. Contact a health care provider if:  Your symptoms do not improve or they get worse.  You have abnormal vaginal discharge. Get help right away if you:  Have a fever.  Have nausea and vomiting.  Have back or side pain.  Feel contractions in your uterus.  Have lower belly pain.  Have a gush of fluid from your vagina.  Have blood in your urine. Summary  A urinary tract infection (UTI) is an infection of any part of the urinary tract, which includes the  kidneys, ureters, bladder, and urethra.  Most urinary tract infections are caused by bacteria in your genital area, around the entrance to your urinary tract (urethra).  You are more likely to develop a UTI during pregnancy.  If you were prescribed an antibiotic medicine, take it as told by your health care provider. Do not stop using the antibiotic even if you start to feel better. This information is not intended to replace advice given to you by your health care provider. Make sure you discuss any questions you have with your health care provider. Document Revised: 11/03/2018 Document Reviewed: 06/15/2018 Elsevier Patient Education  2020 Elsevier Inc.  

## 2020-07-02 ENCOUNTER — Ambulatory Visit: Payer: Medicaid Other

## 2020-07-04 ENCOUNTER — Ambulatory Visit (HOSPITAL_BASED_OUTPATIENT_CLINIC_OR_DEPARTMENT_OTHER): Payer: Medicaid Other

## 2020-07-04 ENCOUNTER — Encounter: Payer: Self-pay | Admitting: *Deleted

## 2020-07-04 ENCOUNTER — Ambulatory Visit: Payer: Medicaid Other | Attending: Obstetrics and Gynecology | Admitting: *Deleted

## 2020-07-04 ENCOUNTER — Other Ambulatory Visit: Payer: Self-pay

## 2020-07-04 DIAGNOSIS — Z3A23 23 weeks gestation of pregnancy: Secondary | ICD-10-CM

## 2020-07-04 DIAGNOSIS — Z362 Encounter for other antenatal screening follow-up: Secondary | ICD-10-CM | POA: Insufficient documentation

## 2020-07-04 DIAGNOSIS — Z3400 Encounter for supervision of normal first pregnancy, unspecified trimester: Secondary | ICD-10-CM

## 2020-07-09 ENCOUNTER — Encounter: Payer: Self-pay | Admitting: Certified Nurse Midwife

## 2020-07-09 ENCOUNTER — Ambulatory Visit (INDEPENDENT_AMBULATORY_CARE_PROVIDER_SITE_OTHER): Payer: Medicaid Other | Admitting: Certified Nurse Midwife

## 2020-07-09 ENCOUNTER — Other Ambulatory Visit: Payer: Self-pay

## 2020-07-09 VITALS — BP 120/72 | HR 80 | Temp 97.9°F | Wt 162.0 lb

## 2020-07-09 DIAGNOSIS — Z3400 Encounter for supervision of normal first pregnancy, unspecified trimester: Secondary | ICD-10-CM

## 2020-07-09 DIAGNOSIS — Z3A24 24 weeks gestation of pregnancy: Secondary | ICD-10-CM

## 2020-07-09 DIAGNOSIS — M549 Dorsalgia, unspecified: Secondary | ICD-10-CM

## 2020-07-09 DIAGNOSIS — O26892 Other specified pregnancy related conditions, second trimester: Secondary | ICD-10-CM

## 2020-07-09 LAB — POCT URINALYSIS DIPSTICK OB
Bilirubin, UA: NEGATIVE
Blood, UA: NEGATIVE
Glucose, UA: NEGATIVE
Ketones, UA: NEGATIVE
Leukocytes, UA: NEGATIVE
Nitrite, UA: NEGATIVE
Spec Grav, UA: 1.02 (ref 1.010–1.025)
Urobilinogen, UA: 0.2 E.U./dL
pH, UA: 7.5 (ref 5.0–8.0)

## 2020-07-09 NOTE — Patient Instructions (Addendum)
Glucose Tolerance Test During Pregnancy Why am I having this test? The glucose tolerance test (GTT) is done to check how your body processes sugar (glucose). This is one of several tests used to diagnose diabetes that develops during pregnancy (gestational diabetes mellitus). Gestational diabetes is a temporary form of diabetes that some women develop during pregnancy. It usually occurs during the second trimester of pregnancy and goes away after delivery. Testing (screening) for gestational diabetes usually occurs between 24 and 28 weeks of pregnancy. You may have the GTT test after having a 1-hour glucose screening test if the results from that test indicate that you may have gestational diabetes. You may also have this test if:  You have a history of gestational diabetes.  You have a history of giving birth to very large babies or have experienced repeated fetal loss (stillbirth).  You have signs and symptoms of diabetes, such as: ? Changes in your vision. ? Tingling or numbness in your hands or feet. ? Changes in hunger, thirst, and urination that are not otherwise explained by your pregnancy. What is being tested? This test measures the amount of glucose in your blood at different times during a period of 3 hours. This indicates how well your body is able to process glucose. What kind of sample is taken?  Blood samples are required for this test. They are usually collected by inserting a needle into a blood vessel. How do I prepare for this test?  For 3 days before your test, eat normally. Have plenty of carbohydrate-rich foods.  Follow instructions from your health care provider about: ? Eating or drinking restrictions on the day of the test. You may be asked to not eat or drink anything other than water (fast) starting 8-10 hours before the test. ? Changing or stopping your regular medicines. Some medicines may interfere with this test. Tell a health care provider about:  All  medicines you are taking, including vitamins, herbs, eye drops, creams, and over-the-counter medicines.  Any blood disorders you have.  Any surgeries you have had.  Any medical conditions you have. What happens during the test? First, your blood glucose will be measured. This is referred to as your fasting blood glucose, since you fasted before the test. Then, you will drink a glucose solution that contains a certain amount of glucose. Your blood glucose will be measured again 1, 2, and 3 hours after drinking the solution. This test takes about 3 hours to complete. You will need to stay at the testing location during this time. During the testing period:  Do not eat or drink anything other than the glucose solution.  Do not exercise.  Do not use any products that contain nicotine or tobacco, such as cigarettes and e-cigarettes. If you need help stopping, ask your health care provider. The testing procedure may vary among health care providers and hospitals. How are the results reported? Your results will be reported as milligrams of glucose per deciliter of blood (mg/dL) or millimoles per liter (mmol/L). Your health care provider will compare your results to normal ranges that were established after testing a large group of people (reference ranges). Reference ranges may vary among labs and hospitals. For this test, common reference ranges are:  Fasting: less than 95-105 mg/dL (5.3-5.8 mmol/L).  1 hour after drinking glucose: less than 180-190 mg/dL (10.0-10.5 mmol/L).  2 hours after drinking glucose: less than 155-165 mg/dL (8.6-9.2 mmol/L).  3 hours after drinking glucose: 140-145 mg/dL (7.8-8.1 mmol/L). What do the   results mean? Results within reference ranges are considered normal, meaning that your glucose levels are well-controlled. If two or more of your blood glucose levels are high, you may be diagnosed with gestational diabetes. If only one level is high, your health care  provider may suggest repeat testing or other tests to confirm a diagnosis. Talk with your health care provider about what your results mean. Questions to ask your health care provider Ask your health care provider, or the department that is doing the test:  When will my results be ready?  How will I get my results?  What are my treatment options?  What other tests do I need?  What are my next steps? Summary  The glucose tolerance test (GTT) is one of several tests used to diagnose diabetes that develops during pregnancy (gestational diabetes mellitus). Gestational diabetes is a temporary form of diabetes that some women develop during pregnancy.  You may have the GTT test after having a 1-hour glucose screening test if the results from that test indicate that you may have gestational diabetes. You may also have this test if you have any symptoms or risk factors for gestational diabetes.  Talk with your health care provider about what your results mean. This information is not intended to replace advice given to you by your health care provider. Make sure you discuss any questions you have with your health care provider. Document Revised: 11/02/2018 Document Reviewed: 02/21/2017 Elsevier Patient Education  2020 Elsevier Inc.  Round Ligament Massage & Stretches  Massage: Starting at the middle of your pubic bone, trace little circles in a wide U from your pubic bone to your hip bones on both sides.  Then starting just above your pubic bone, press in and down, alternating sides to create a gentle rocking of your uterus back and forth.  Move your hands up the sides of your belly and back down. Do this 3-5 times upon waking and before bed.  Stretches: Get on hands and knees and alternate arching your back deeply while inhaling, and then rounding your back while exhaling. Modified runners lunge:  - Sit on a chair with half of your bottom on the chair and half off.  - Sit up tall,  plant your front foot, and stretch your other foot out behind you.  - Breathe deeply for 5 breaths and then do the other side.

## 2020-07-09 NOTE — Addendum Note (Signed)
Addended by: Clovis Pu on: 07/09/2020 10:23 AM   Modules accepted: Orders

## 2020-07-09 NOTE — Progress Notes (Signed)
   PRENATAL VISIT NOTE  Subjective:  Norma Rodriguez is a 19 y.o. G1P0 at [redacted]w[redacted]d being seen today for ongoing prenatal care.  She is currently monitored for the following issues for this low-risk pregnancy and has Supervision of normal first pregnancy, antepartum on their problem list.  Patient reports low back pain.  Contractions: Not present. Vag. Bleeding: None.  Movement: Present. Denies leaking of fluid.   The following portions of the patient's history were reviewed and updated as appropriate: allergies, current medications, past family history, past medical history, past social history, past surgical history and problem list.   Objective:   Vitals:   07/09/20 0945  BP: 120/72  Pulse: 80  Temp: 97.9 F (36.6 C)  Weight: 162 lb (73.5 kg)    Fetal Status: Fetal Heart Rate (bpm): 140 Fundal Height: 25 cm Movement: Present     General:  Alert, oriented and cooperative. Patient is in no acute distress.  Skin: Skin is warm and dry. No rash noted.   Cardiovascular: Normal heart rate noted  Respiratory: Normal respiratory effort, no problems with respiration noted  Abdomen: Soft, gravid, appropriate for gestational age.  Pain/Pressure: Absent     Pelvic: Cervical exam deferred        Extremities: Normal range of motion.  Edema: None  Mental Status: Normal mood and affect. Normal behavior. Normal judgment and thought content.   Assessment and Plan:  Pregnancy: G1P0 at [redacted]w[redacted]d 1. Supervision of normal first pregnancy, antepartum - Patient doing well, reports occasional mid low back pain  - Routine prenatal care  - Anticipatory guidance on upcoming appointments with next being GTT, discussed with patient to present to appointment fasting after MN, patient verbalizes understanding   2. Back pain during pregnancy in second trimester - Educated and discussed physiological changes during pregnancy and reasoning for back pain  - Discussed with patient stretches and massages that will help  with back pain  - UA ordered to r/o infection  - Culture, OB Urine  3. [redacted] weeks gestation of pregnancy  Preterm labor symptoms and general obstetric precautions including but not limited to vaginal bleeding, contractions, leaking of fluid and fetal movement were reviewed in detail with the patient. Please refer to After Visit Summary for other counseling recommendations.   Return in about 4 weeks (around 08/06/2020) for LROB, GTT, in person.  Future Appointments  Date Time Provider Department Center  08/06/2020  8:10 AM Gerrit Heck, CNM [facility] None    Sharyon Cable, CNM

## 2020-07-11 LAB — CULTURE, OB URINE

## 2020-07-11 LAB — URINE CULTURE, OB REFLEX

## 2020-08-06 ENCOUNTER — Ambulatory Visit (INDEPENDENT_AMBULATORY_CARE_PROVIDER_SITE_OTHER): Payer: Medicaid Other

## 2020-08-06 ENCOUNTER — Other Ambulatory Visit: Payer: Self-pay

## 2020-08-06 VITALS — BP 115/68 | HR 73 | Temp 97.8°F | Wt 165.2 lb

## 2020-08-06 DIAGNOSIS — Z3A28 28 weeks gestation of pregnancy: Secondary | ICD-10-CM | POA: Diagnosis not present

## 2020-08-06 DIAGNOSIS — Z3400 Encounter for supervision of normal first pregnancy, unspecified trimester: Secondary | ICD-10-CM

## 2020-08-06 DIAGNOSIS — Z23 Encounter for immunization: Secondary | ICD-10-CM

## 2020-08-06 NOTE — Progress Notes (Signed)
   LOW-RISK PREGNANCY OFFICE VISIT  Patient name: Norma Rodriguez MRN [id number]  Date of birth: [birth year] Chief Complaint:   No chief complaint on file.  Subjective:   Norma Rodriguez is a 20 y.o. G1P0 female at 102w4d with an Estimated Date of Delivery: 10/25/20 being seen today for ongoing management of a low-risk pregnancy aeb has Supervision of normal first pregnancy, antepartum on their problem list.  Patient presents today with complaint of breast leakage and questions if this is normal. Patient also with questions regarding vaccination recommendations.  Patient endorses fetal movement and  denies vaginal concerns including abnormal discharge, leaking of fluid, and bleeding.  Contractions: Not present. Vag. Bleeding: None.  Movement: Present.  Reviewed past medical,surgical, social, obstetrical and family history as well as problem list, medications and allergies.  Objective   Vitals:   08/06/20 0816  BP: 115/68  Pulse: 73  Temp: 97.8 F (36.6 C)  Weight: 165 lb 3.2 oz (74.9 kg)  Body mass index is 29.26 kg/m.  Total Weight Gain:13 lb 3.2 oz (5.987 kg)         Physical Examination:   General appearance: Well appearing, and in no distress  Mental status: Alert, oriented to person, place, and time  Skin: Warm & dry  Cardiovascular: Normal heart rate noted  Respiratory: Normal respiratory effort, no distress  Abdomen: Soft, gravid, nontender, AGA with Fundal height of Fundal Height: 28 cm  Pelvic: Cervical exam deferred           Extremities: Edema: None  Fetal Status: Fetal Heart Rate (bpm): 150  Movement: Present   No results found for this or any previous visit (from the past 24 hour(s)).  Assessment & Plan:  Low-risk pregnancy of a 20 y.o., G1P0 at [redacted]w[redacted]d with an Estimated Date of Delivery: 10/25/20   1. Supervision of normal first pregnancy, antepartum -Reviewed c/o breast leakage.  Reassured that normal to have during pregnancy. -Encouraged to not stimulate breast as this  could result in further leakage. -Instructed to notify staff if pain or discomfort of breast occur. -Anticipatory guidance for upcoming appts. -Plan for next visit in 2 weeks virtually.  2. [redacted] weeks gestation of pregnancy -Doing well overall. -GTT completed today -Discussed releasing of results for GTT and follow up as needed. -Reviewed iron supplementation as needed.  3. Need for tetanus, diphtheria, and acellular pertussis (Tdap) vaccine -Reviewed recommendation of all vaccinations including Tdap, Influenza, and Covid. -Emphasized risk of whooping cough to infant and how it is transmitted by asymptomatic adults. -Informed that if any vaccine should be received provider would highly recommend all, but this is very beneficial to infant.    Meds: No orders of the defined types were placed in this encounter.  Labs/procedures today:  Lab Orders     Glucose Tolerance, 2 Hours w/1 Hour     HIV Antibody (routine testing w rflx)     RPR     CBC   Reviewed: Preterm labor symptoms and general obstetric precautions including but not limited to vaginal bleeding, contractions, leaking of fluid and fetal movement were reviewed in detail with the patient.  All questions were answered.  Follow-up: Return in about 2 weeks (around 08/20/2020) for Virtual LR-ROB.  Orders Placed This Encounter  Procedures  . Glucose Tolerance, 2 Hours w/1 Hour  . HIV Antibody (routine testing w rflx)  . RPR  . CBC   Cherre Robins MSN, CNM 08/06/2020

## 2020-08-06 NOTE — Patient Instructions (Signed)
Tdap (Tetanus, Diphtheria, Pertussis) Vaccine: What You Need to Know 1. Why get vaccinated? Tdap vaccine can prevent tetanus, diphtheria, and pertussis. Diphtheria and pertussis spread from person to person. Tetanus enters the body through cuts or wounds.  TETANUS (T) causes painful stiffening of the muscles. Tetanus can lead to serious health problems, including being unable to open the mouth, having trouble swallowing and breathing, or death.  DIPHTHERIA (D) can lead to difficulty breathing, heart failure, paralysis, or death.  PERTUSSIS (aP), also known as "whooping cough," can cause uncontrollable, violent coughing that makes it hard to breathe, eat, or drink. Pertussis can be extremely serious especially in babies and young children, causing pneumonia, convulsions, brain damage, or death. In teens and adults, it can cause weight loss, loss of bladder control, passing out, and rib fractures from severe coughing. 2. Tdap vaccine Tdap is only for children 7 years and older, adolescents, and adults.  Adolescents should receive a single dose of Tdap, preferably at age 11 or 12 years. Pregnant people should get a dose of Tdap during every pregnancy, preferably during the early part of the third trimester, to help protect the newborn from pertussis. Infants are most at risk for severe, life-threatening complications from pertussis. Adults who have never received Tdap should get a dose of Tdap. Also, adults should receive a booster dose of either Tdap or Td (a different vaccine that protects against tetanus and diphtheria but not pertussis) every 10 years, or after 5 years in the case of a severe or dirty wound or burn. Tdap may be given at the same time as other vaccines. 3. Talk with your health care provider Tell your vaccine provider if the person getting the vaccine:  Has had an allergic reaction after a previous dose of any vaccine that protects against tetanus, diphtheria, or pertussis, or  has any severe, life-threatening allergies  Has had a coma, decreased level of consciousness, or prolonged seizures within 7 days after a previous dose of any pertussis vaccine (DTP, DTaP, or Tdap)  Has seizures or another nervous system problem  Has ever had Guillain-Barr Syndrome (also called "GBS")  Has had severe pain or swelling after a previous dose of any vaccine that protects against tetanus or diphtheria In some cases, your health care provider may decide to postpone Tdap vaccination until a future visit. People with minor illnesses, such as a cold, may be vaccinated. People who are moderately or severely ill should usually wait until they recover before getting Tdap vaccine.  Your health care provider can give you more information. 4. Risks of a vaccine reaction  Pain, redness, or swelling where the shot was given, mild fever, headache, feeling tired, and nausea, vomiting, diarrhea, or stomachache sometimes happen after Tdap vaccination. People sometimes faint after medical procedures, including vaccination. Tell your provider if you feel dizzy or have vision changes or ringing in the ears.  As with any medicine, there is a very remote chance of a vaccine causing a severe allergic reaction, other serious injury, or death. 5. What if there is a serious problem? An allergic reaction could occur after the vaccinated person leaves the clinic. If you see signs of a severe allergic reaction (hives, swelling of the face and throat, difficulty breathing, a fast heartbeat, dizziness, or weakness), call 9-1-1 and get the person to the nearest hospital. For other signs that concern you, call your health care provider.  Adverse reactions should be reported to the Vaccine Adverse Event Reporting System (VAERS). Your health   care provider will usually file this report, or you can do it yourself. Visit the VAERS website at www.vaers.hhs.gov or call 1-800-822-7967. VAERS is only for reporting  reactions, and VAERS staff members do not give medical advice. 6. The National Vaccine Injury Compensation Program The National Vaccine Injury Compensation Program (VICP) is a federal program that was created to compensate people who may have been injured by certain vaccines. Claims regarding alleged injury or death due to vaccination have a time limit for filing, which may be as short as two years. Visit the VICP website at www.hrsa.gov/vaccinecompensation or call 1-800-338-2382 to learn about the program and about filing a claim. 7. How can I learn more?  Ask your health care provider.  Call your local or state health department.  Visit the website of the Food and Drug Administration (FDA) for vaccine package inserts and additional information at www.fda.gov/vaccines-blood-biologics/vaccines.  Contact the Centers for Disease Control and Prevention (CDC): ? Call 1-800-232-4636 (1-800-CDC-INFO) or ? Visit CDC's website at www.cdc.gov/vaccines. Vaccine Information Statement Tdap (Tetanus, Diphtheria, Pertussis) Vaccine (02/29/2020) This information is not intended to replace advice given to you by your health care provider. Make sure you discuss any questions you have with your health care provider. Document Revised: 03/26/2020 Document Reviewed: 03/26/2020 Elsevier Patient Education  2021 Elsevier Inc.  

## 2020-08-07 LAB — CBC
Hematocrit: 33.9 % — ABNORMAL LOW (ref 34.0–46.6)
Hemoglobin: 11.5 g/dL (ref 11.1–15.9)
MCH: 30.2 pg (ref 26.6–33.0)
MCHC: 33.9 g/dL (ref 31.5–35.7)
MCV: 89 fL (ref 79–97)
Platelets: 263 10*3/uL (ref 150–450)
RBC: 3.81 x10E6/uL (ref 3.77–5.28)
RDW: 12.3 % (ref 11.7–15.4)
WBC: 8 10*3/uL (ref 3.4–10.8)

## 2020-08-07 LAB — GLUCOSE TOLERANCE, 2 HOURS W/ 1HR
Glucose, 1 hour: 143 mg/dL (ref 65–179)
Glucose, 2 hour: 93 mg/dL (ref 65–152)
Glucose, Fasting: 74 mg/dL (ref 65–91)

## 2020-08-07 LAB — RPR: RPR Ser Ql: NONREACTIVE

## 2020-08-07 LAB — HIV ANTIBODY (ROUTINE TESTING W REFLEX): HIV Screen 4th Generation wRfx: NONREACTIVE

## 2020-08-15 ENCOUNTER — Other Ambulatory Visit: Payer: Self-pay

## 2020-08-15 ENCOUNTER — Telehealth (INDEPENDENT_AMBULATORY_CARE_PROVIDER_SITE_OTHER): Payer: Medicaid Other

## 2020-08-15 DIAGNOSIS — Z3A29 29 weeks gestation of pregnancy: Secondary | ICD-10-CM

## 2020-08-15 DIAGNOSIS — Z3400 Encounter for supervision of normal first pregnancy, unspecified trimester: Secondary | ICD-10-CM

## 2020-08-15 DIAGNOSIS — Z3403 Encounter for supervision of normal first pregnancy, third trimester: Secondary | ICD-10-CM

## 2020-08-15 NOTE — Progress Notes (Signed)
° °  OBSTETRICS PRENATAL VIRTUAL VISIT ENCOUNTER NOTE  Provider location: [facility]   I connected with Dustin Folks on 08/15/20 at  8:30 AM EST by MyChart Video Encounter at home and verified that I am speaking with the correct person using two identifiers.   I discussed the limitations, risks, security and privacy concerns of performing an evaluation and management service virtually and the availability of in person appointments. I also discussed with the patient that there may be a patient responsible charge related to this service. The patient expressed understanding and agreed to proceed. Subjective:  Norma Rodriguez is a 20 y.o. G1P0 at [redacted]w[redacted]d being seen today for ongoing prenatal care.  She is currently monitored for the following issues for this low-risk pregnancy and has Supervision of normal first pregnancy, antepartum on their problem list.  Patient reports no complaints. Patient has no pregnancy related concerns and endorses fetal movement.  She denies contractions and vaginal concerns including discharge, bleeding, leaking, odor, itching, or irritation.   Contractions: Not present. Vag. Bleeding: None.  Movement: Present. Denies any leaking of fluid.   The following portions of the patient's history were reviewed and updated as appropriate: allergies, current medications, past family history, past medical history, past social history, past surgical history and problem list.   Objective:  There were no vitals filed for this visit.  Fetal Status:     Movement: Present     General:  Alert, oriented and cooperative. Patient is in no acute distress.  Respiratory: Normal respiratory effort, no problems with respiration noted  Mental Status: Normal mood and affect. Normal behavior. Normal judgment and thought content.  Rest of physical exam deferred due to type of encounter  Imaging: No results found.  Assessment and Plan:  Pregnancy: G1P0 at  [redacted]w[redacted]d 1. Supervision of normal first pregnancy, antepartum -Anticipatory guidance for upcoming appts. -Patient to next appt in 2 weeks as an in-person visit. -Instructed to send her blood pressure, via mychart, once she picks her cuff up.   2. [redacted] weeks gestation of pregnancy -Doing well. -Reports breast leakage has improved.   Preterm labor symptoms and general obstetric precautions including but not limited to vaginal bleeding, contractions, leaking of fluid and fetal movement were reviewed in detail with the patient. I discussed the assessment and treatment plan with the patient. The patient was provided an opportunity to ask questions and all were answered. The patient agreed with the plan and demonstrated an understanding of the instructions. The patient was advised to call back or seek an in-person office evaluation/go to [facility] at [facility] for any urgent or concerning symptoms. Please refer to After Visit Summary for other counseling recommendations.   I provided 5 minutes of face-to-face time during this encounter.  Return in about 2 weeks (around 08/29/2020) for Virtual LR-ROB.  No future appointments.  Cherre Robins, CNM [facility] [employer]

## 2020-08-15 NOTE — Patient Instructions (Signed)

## 2020-08-27 ENCOUNTER — Ambulatory Visit (INDEPENDENT_AMBULATORY_CARE_PROVIDER_SITE_OTHER): Payer: Medicaid Other | Admitting: Advanced Practice Midwife

## 2020-08-27 ENCOUNTER — Other Ambulatory Visit: Payer: Self-pay

## 2020-08-27 ENCOUNTER — Encounter: Payer: Self-pay | Admitting: Advanced Practice Midwife

## 2020-08-27 VITALS — BP 119/71 | HR 74 | Temp 98.1°F | Wt 166.0 lb

## 2020-08-27 DIAGNOSIS — Z3A31 31 weeks gestation of pregnancy: Secondary | ICD-10-CM

## 2020-08-27 DIAGNOSIS — Z3400 Encounter for supervision of normal first pregnancy, unspecified trimester: Secondary | ICD-10-CM

## 2020-08-27 NOTE — Patient Instructions (Signed)

## 2020-08-27 NOTE — Progress Notes (Signed)
   PRENATAL VISIT NOTE  Subjective:  Norma Rodriguez is a 20 y.o. G1P0 at [redacted]w[redacted]d being seen today for ongoing prenatal care.  She is currently monitored for the following issues for this low-risk pregnancy and has Supervision of normal first pregnancy, antepartum on their problem list.  Patient reports no complaints.  Contractions: Not present. Vag. Bleeding: None.  Movement: Present. Denies leaking of fluid.   The following portions of the patient's history were reviewed and updated as appropriate: allergies, current medications, past family history, past medical history, past social history, past surgical history and problem list.   Objective:   Vitals:   08/27/20 1028  BP: 119/71  Pulse: 74  Temp: 98.1 F (36.7 C)  Weight: 166 lb (75.3 kg)    Fetal Status: Fetal Heart Rate (bpm): 154   Movement: Present     General:  Alert, oriented and cooperative. Patient is in no acute distress.  Skin: Skin is warm and dry. No rash noted.   Cardiovascular: Normal heart rate noted  Respiratory: Normal respiratory effort, no problems with respiration noted  Abdomen: Soft, gravid, appropriate for gestational age.  Pain/Pressure: Absent     Pelvic: Cervical exam deferred        Extremities: Normal range of motion.  Edema: None  Mental Status: Normal mood and affect. Normal behavior. Normal judgment and thought content.   Assessment and Plan:  Pregnancy: G1P0 at [redacted]w[redacted]d 1. [redacted] weeks gestation of pregnancy - routine care  2. Supervision of normal first pregnancy, antepartum - contraception choices handout given to patient she is undecided regarding plans for contraception.   Preterm labor symptoms and general obstetric precautions including but not limited to vaginal bleeding, contractions, leaking of fluid and fetal movement were reviewed in detail with the patient. Please refer to After Visit Summary for other counseling recommendations.   Return in about 2 weeks (around 09/10/2020).  No  future appointments.  Thressa Sheller DNP, CNM  08/27/20  10:36 AM

## 2020-09-10 ENCOUNTER — Encounter: Payer: Medicaid Other | Admitting: Obstetrics and Gynecology

## 2020-09-17 ENCOUNTER — Other Ambulatory Visit: Payer: Self-pay

## 2020-09-17 ENCOUNTER — Ambulatory Visit (INDEPENDENT_AMBULATORY_CARE_PROVIDER_SITE_OTHER): Payer: Medicaid Other | Admitting: Student

## 2020-09-17 VITALS — BP 131/78 | HR 73

## 2020-09-17 DIAGNOSIS — Z3400 Encounter for supervision of normal first pregnancy, unspecified trimester: Secondary | ICD-10-CM

## 2020-09-17 DIAGNOSIS — Z3A34 34 weeks gestation of pregnancy: Secondary | ICD-10-CM

## 2020-09-17 NOTE — Patient Instructions (Addendum)
The Maternity Assessment Unit (MAU) is located at the [facility]. The address is: [address] [place], [place], [place] [address]. Please see map below for additional directions.    The Maternity Assessment Unit is designed to help you during your pregnancy, and for up to 6 weeks after delivery, with any pregnancy- or postpartum-related emergencies, if you think you are in labor, or if your water has broken. For example, if you experience nausea and vomiting, vaginal bleeding, severe abdominal or pelvic pain, elevated blood pressure or other problems related to your pregnancy or postpartum time, please come to the Maternity Assessment Unit for assistance.        Group B Streptococcus Test During Pregnancy Why am I having this test? Routine testing, also called screening, for group B streptococcus (GBS) is recommended for all pregnant women between the 36th and 37th week of pregnancy. GBS is a type of bacteria that can be passed from mother to baby during childbirth. Screening will help guide whether or not you will need treatment during labor and delivery to prevent complications such as:  An infection in your uterus during labor.  An infection in your uterus after delivery.  A serious infection in your baby after delivery, such as pneumonia, meningitis, or sepsis. GBS screening is not often done before 36 weeks of pregnancy unless you go into labor prematurely. What happens if I have group B streptococcus? If testing shows that you have GBS, your health care provider will recommend treatment with IV antibiotics during labor and delivery. This treatment significantly decreases the risk of complications for you and your baby. If you have a planned C-section and you have GBS, you may not need to be treated with antibiotics because GBS is usually passed to babies after labor starts and your water breaks. If you are in labor or your water  breaks before your C-section, it is possible for GBS to get into your uterus and be passed to your baby, so you might need treatment. Is there a chance I may not need to be tested? You may not need to be tested for GBS if:  You have a urine test that shows GBS before 36 to 37 weeks.  You had a baby with GBS infection after a previous delivery. In these cases, you will automatically be treated for GBS during labor and delivery. What is being tested? This test is done to check if you have group B streptococcus in your vagina or rectum. What kind of sample is taken? To collect samples for this test, your health care provider will swab your vagina and rectum with a cotton swab. The sample is then sent to the lab to see if GBS is present. What happens during the test?  You will remove your clothing from the waist down.  You will lie down on an exam table in the same position as you would for a pelvic exam.  Your health care provider will swab your vagina and rectum to collect samples for a culture test.  You will be able to go home after the test and do all your usual activities.   How are the results reported? The test results are reported as positive or negative. What do the results mean?  A positive test means you are at risk for passing GBS to your baby during labor and delivery. Your health care provider will recommend that you are treated with an IV antibiotic during labor and  delivery.  A negative test means you are at very low risk of passing GBS to your baby. There is still a low risk of passing GBS to your baby because sometimes test results may report that you do not have a condition when you do (false-negative result) or there is a chance that you may become infected with GBS after the test is done. You most likely will not need to be treated with an antibiotic during labor and delivery. Talk with your health care provider about what your results mean. Questions to ask your health  care provider Ask your health care provider, or the department that is doing the test:  When will my results be ready?  How will I get my results?  What are my treatment options? Summary  Routine testing (screening) for group B streptococcus (GBS) is recommended for all pregnant women between the 36th and 37th week of pregnancy.  GBS is a type of bacteria that can be passed from mother to baby during childbirth.  If testing shows that you have GBS, your health care provider will recommend that you are treated with IV antibiotics during labor and delivery. This treatment almost always prevents infection in newborns. This information is not intended to replace advice given to you by your health care provider. Make sure you discuss any questions you have with your health care provider. Document Revised: 05/13/2020 Document Reviewed: 08/09/2018 Elsevier Patient Education  2021 [employer].

## 2020-09-17 NOTE — Progress Notes (Signed)
   PRENATAL VISIT NOTE  Subjective:  Norma Rodriguez is a 20 y.o. G1P0 at [redacted]w[redacted]d being seen today for ongoing prenatal care.  She is currently monitored for the following issues for this low-risk pregnancy and has Supervision of normal first pregnancy, antepartum on their problem list.  Patient reports no complaints.  Contractions: Not present. Vag. Bleeding: None.  Movement: Present. Denies leaking of fluid.   The following portions of the patient's history were reviewed and updated as appropriate: allergies, current medications, past family history, past medical history, past social history, past surgical history and problem list.   Objective:   Vitals:   09/17/20 1036  BP: 131/78  Pulse: 73    Fetal Status: Fetal Heart Rate (bpm): 143 Fundal Height: 34 cm Movement: Present     General:  Alert, oriented and cooperative. Patient is in no acute distress.  Skin: Skin is warm and dry. No rash noted.   Cardiovascular: Normal heart rate noted  Respiratory: Normal respiratory effort, no problems with respiration noted  Abdomen: Soft, gravid, appropriate for gestational age.  Pain/Pressure: Absent     Pelvic: Cervical exam deferred        Extremities: Normal range of motion.  Edema: None  Mental Status: Normal mood and affect. Normal behavior. Normal judgment and thought content.   Assessment and Plan:  Pregnancy: G1P0 at [redacted]w[redacted]d 1. Supervision of normal first pregnancy, antepartum -doing well. No complaints. Discussed upcoming appointment  2. [redacted] weeks gestation of pregnancy     Preterm labor symptoms and general obstetric precautions including but not limited to vaginal bleeding, contractions, leaking of fluid and fetal movement were reviewed in detail with the patient. Please refer to After Visit Summary for other counseling recommendations.   Return in about 2 weeks (around 10/01/2020) for Routine OB, in person.  Future Appointments  Date Time Provider Department Center  10/01/2020  10:50 AM Raelyn Mora, CNM [facility] None    Judeth Horn, NP

## 2020-10-01 ENCOUNTER — Encounter: Payer: Self-pay | Admitting: Obstetrics and Gynecology

## 2020-10-01 ENCOUNTER — Ambulatory Visit (INDEPENDENT_AMBULATORY_CARE_PROVIDER_SITE_OTHER): Payer: Managed Care, Other (non HMO) | Admitting: Obstetrics and Gynecology

## 2020-10-01 ENCOUNTER — Other Ambulatory Visit: Payer: Self-pay

## 2020-10-01 ENCOUNTER — Other Ambulatory Visit (HOSPITAL_COMMUNITY)
Admission: RE | Admit: 2020-10-01 | Discharge: 2020-10-01 | Disposition: A | Discharge status: Home / Self Care | Payer: Managed Care, Other (non HMO) | Source: Ambulatory Visit | Attending: Obstetrics and Gynecology | Admitting: Obstetrics and Gynecology

## 2020-10-01 VITALS — BP 119/75 | HR 76 | Temp 97.7°F | Wt 176.2 lb

## 2020-10-01 DIAGNOSIS — B373 Candidiasis of vulva and vagina: Secondary | ICD-10-CM | POA: Insufficient documentation

## 2020-10-01 DIAGNOSIS — Z3400 Encounter for supervision of normal first pregnancy, unspecified trimester: Secondary | ICD-10-CM | POA: Diagnosis not present

## 2020-10-01 DIAGNOSIS — O23593 Infection of other part of genital tract in pregnancy, third trimester: Secondary | ICD-10-CM | POA: Diagnosis not present

## 2020-10-01 DIAGNOSIS — Z3A36 36 weeks gestation of pregnancy: Secondary | ICD-10-CM

## 2020-10-01 DIAGNOSIS — Z3403 Encounter for supervision of normal first pregnancy, third trimester: Secondary | ICD-10-CM | POA: Diagnosis present

## 2020-10-01 NOTE — Progress Notes (Signed)
   LOW-RISK PREGNANCY OFFICE VISIT Patient name: Norma Rodriguez MRN [id number]  Date of birth: 10-26-2000 Chief Complaint:   Routine Prenatal Visit  History of Present Illness:   Norma Rodriguez is a 20 y.o. G1P0 female at [redacted]w[redacted]d with an Estimated Date of Delivery: 10/25/20 being seen today for ongoing management of a low-risk pregnancy.  Today she reports increased pelvic pressure. Contractions: Not present. Vag. Bleeding: None.  Movement: Present. denies leaking of fluid. Review of Systems:   Pertinent items are noted in HPI Denies abnormal vaginal discharge w/ itching/odor/irritation, headaches, visual changes, shortness of breath, chest pain, abdominal pain, severe nausea/vomiting, or problems with urination or bowel movements unless otherwise stated above. Pertinent History Reviewed:  Reviewed past medical,surgical, social, obstetrical and family history.  Reviewed problem list, medications and allergies. Physical Assessment:   Vitals:   10/01/20 1048  BP: 119/75  Pulse: 76  Temp: 97.7 F (36.5 C)  Weight: 176 lb 3.2 oz (79.9 kg)  Body mass index is 31.21 kg/m.        Physical Examination:   General appearance: Well appearing, and in no distress  Mental status: Alert, oriented to person, place, and time  Skin: Warm & dry  Cardiovascular: Normal heart rate noted  Respiratory: Normal respiratory effort, no distress  Abdomen: Soft, gravid, nontender  Pelvic: Cervical exam performed  Dilation: Closed Effacement (%): Thick Station: Ballotable  Extremities: Edema: None  Fetal Status: Fetal Heart Rate (bpm): 138 Fundal Height: 36 cm Movement: Present Presentation: Vertex  No results found for this or any previous visit (from the past 24 hour(s)).  Assessment & Plan:  1) Low-risk pregnancy G1P0 at [redacted]w[redacted]d with an Estimated Date of Delivery: 10/25/20   2) Supervision of normal first pregnancy, antepartum  - Culture, beta strep (group b only),  - Cervicovaginal ancillary only( [facility])  3) [redacted] weeks gestation of pregnancy    Meds: No orders of the defined types were placed in this encounter.  Labs/procedures today: cervical exam  Plan:  Continue routine obstetrical care   Reviewed: Preterm labor symptoms and general obstetric precautions including but not limited to vaginal bleeding, contractions, leaking of fluid and fetal movement were reviewed in detail with the patient.  All questions were answered. Has home bp cuff. Check bp weekly, let us know if >140/90.   Follow-up: Return in about 1 week (around 10/08/2020) for Return OB visit.  Orders Placed This Encounter  Procedures  . Culture, beta strep (group b only)   Raelyn Mora MSN, CNM 10/01/2020 11:06 AM

## 2020-10-02 ENCOUNTER — Telehealth: Payer: Self-pay | Admitting: *Deleted

## 2020-10-02 DIAGNOSIS — B3731 Acute candidiasis of vulva and vagina: Secondary | ICD-10-CM

## 2020-10-02 DIAGNOSIS — B373 Candidiasis of vulva and vagina: Secondary | ICD-10-CM

## 2020-10-02 LAB — CERVICOVAGINAL ANCILLARY ONLY
Bacterial Vaginitis (gardnerella): NEGATIVE
Candida Glabrata: NEGATIVE
Candida Vaginitis: POSITIVE — AB
Chlamydia: NEGATIVE
Comment: NEGATIVE
Comment: NEGATIVE
Comment: NEGATIVE
Comment: NEGATIVE
Comment: NEGATIVE
Comment: NORMAL
Neisseria Gonorrhea: NEGATIVE
Trichomonas: NEGATIVE

## 2020-10-02 MED ORDER — TERCONAZOLE 0.4 % VA CREA: 1.0000 | TOPICAL_CREAM | Freq: Every day | VAGINAL | 0 refills | Status: AC

## 2020-10-02 NOTE — Telephone Encounter (Signed)
-----   Message from Raelyn Mora, [place] sent at 10/02/2020  3:41 PM EST ----- Treat for yeast

## 2020-10-05 LAB — CULTURE, BETA STREP (GROUP B ONLY): Strep Gp B Culture: NEGATIVE

## 2020-10-09 ENCOUNTER — Encounter: Payer: Medicaid Other | Admitting: Obstetrics and Gynecology

## 2020-10-15 ENCOUNTER — Other Ambulatory Visit: Payer: Self-pay

## 2020-10-15 ENCOUNTER — Ambulatory Visit (INDEPENDENT_AMBULATORY_CARE_PROVIDER_SITE_OTHER): Payer: Medicaid Other | Admitting: Women's Health

## 2020-10-15 VITALS — BP 129/78 | HR 120 | Temp 98.1°F | Wt 181.8 lb

## 2020-10-15 DIAGNOSIS — Z3400 Encounter for supervision of normal first pregnancy, unspecified trimester: Secondary | ICD-10-CM

## 2020-10-15 DIAGNOSIS — Z3A38 38 weeks gestation of pregnancy: Secondary | ICD-10-CM

## 2020-10-15 NOTE — Progress Notes (Signed)
Subjective:  Norma Rodriguez is a 20 y.o. G1P0 at [redacted]w[redacted]d being seen today for ongoing prenatal care.  She is currently monitored for the following issues for this low-risk pregnancy and has Supervision of normal first pregnancy, antepartum on their problem list.  Patient reports no complaints.  Contractions: Irritability. Vag. Bleeding: None.  Movement: Present. Denies leaking of fluid.   The following portions of the patient's history were reviewed and updated as appropriate: allergies, current medications, past family history, past medical history, past social history, past surgical history and problem list. Problem list updated.  Objective:   Vitals:   10/15/20 1405  BP: 129/78  Pulse: (!) 120  Temp: 98.1 F (36.7 C)  Weight: 181 lb 12.8 oz (82.5 kg)    Fetal Status: Fetal Heart Rate (bpm): 140 Fundal Height: 38 cm Movement: Present     General:  Alert, oriented and cooperative. Patient is in no acute distress.  Skin: Skin is warm and dry. No rash noted.   Cardiovascular: Normal heart rate noted  Respiratory: Normal respiratory effort, no problems with respiration noted  Abdomen: Soft, gravid, appropriate for gestational age. Pain/Pressure: Present     Pelvic: Vag. Bleeding: None     Cervical exam deferred        Extremities: Normal range of motion.  Edema: None  Mental Status: Normal mood and affect. Normal behavior. Normal judgment and thought content.   Urinalysis:      Assessment and Plan:  Pregnancy: G1P0 at [redacted]w[redacted]d  1. Supervision of normal first pregnancy, antepartum -IOL not arranged as patient reports she is moving to [facility] and will not be in state for any additional visits after today  2. [redacted] weeks gestation of pregnancy  Term labor symptoms and general obstetric precautions including but not limited to vaginal bleeding, contractions, leaking of fluid and fetal movement were reviewed in detail with the patient. I discussed the assessment and treatment plan with the  patient. The patient was provided an opportunity to ask questions and all were answered. The patient agreed with the plan and demonstrated an understanding of the instructions. The patient was advised to call back or seek an in-person office evaluation/go to MAU at [facility] for any urgent or concerning symptoms. Please refer to After Visit Summary for other counseling recommendations.  Return in about 1 week (around 10/22/2020) for pt moving to [facility], does not need additional visit scheduled at this time.   Saleem Coccia, Odie Sera, NP

## 2020-10-15 NOTE — Patient Instructions (Signed)
Maternity Assessment Unit (MAU)  The Maternity Assessment Unit (MAU) is located at the [facility] and [place] at [facility]. The address is: [address], [place], [place], [place] [address]. Please see map below for additional directions.    The Maternity Assessment Unit is designed to help you during your pregnancy, and for up to 6 weeks after delivery, with any pregnancy- or postpartum-related emergencies, if you think you are in labor, or if your water has broken. For example, if you experience nausea and vomiting, vaginal bleeding, severe abdominal or pelvic pain, elevated blood pressure or other problems related to your pregnancy or postpartum time, please come to the Maternity Assessment Unit for assistance.        Signs and Symptoms of Labor Labor is the body's natural process of moving the baby and the placenta out of the uterus. The process of labor usually starts when the baby is full-term, between 23 and 40 weeks of pregnancy. Signs and symptoms that you are close to going into labor As your body prepares for labor and the birth of your baby, you may notice the following symptoms in the weeks and days before true labor starts:  Passing a small amount of thick, bloody mucus from your vagina. This is called normal bloody show or losing your mucus plug. This may happen more than a week before labor begins, or right before labor begins, as the opening of the cervix starts to widen (dilate). For some women, the entire mucus plug passes at once. For others, pieces of the mucus plug may gradually pass over several days.  Your baby moving (dropping) lower in your pelvis to get into position for birth (lightening). When this happens, you may feel more pressure on your bladder and pelvic bone and less pressure on your ribs. This may make it easier to breathe. It may also cause you to need to urinate more often and have problems with bowel movements.  Having "practice  contractions," also called Braxton Hicks contractions or false labor. These occur at irregular (unevenly spaced) intervals that are more than 10 minutes apart. False labor contractions are common after exercise or sexual activity. They will stop if you change position, rest, or drink fluids. These contractions are usually mild and do not get stronger over time. They may feel like: ? A backache or back pain. ? Mild cramps, similar to menstrual cramps. ? Tightening or pressure in your abdomen. Other early symptoms include:  Nausea or loss of appetite.  Diarrhea.  Having a sudden burst of energy, or feeling very tired.  Mood changes.  Having trouble sleeping.   Signs and symptoms that labor has begun Signs that you are in labor may include:  Having contractions that come at regular (evenly spaced) intervals and increase in intensity. This may feel like more intense tightening or pressure in your abdomen that moves to your back. ? Contractions may also feel like rhythmic pain in your upper thighs or back that comes and goes at regular intervals. ? For first-time mothers, this change in intensity of contractions often occurs at a more gradual pace. ? Women who have given birth before may notice a more rapid progression of contraction changes.  Feeling pressure in the vaginal area.  Your water breaking (rupture of membranes). This is when the sac of fluid that surrounds your baby breaks. Fluid leaking from your vagina may be clear or blood-tinged. Labor usually starts within 24 hours of your water breaking, but it  may take longer to begin. ? Some women may feel a sudden gush of fluid. ? Others notice that their underwear repeatedly becomes damp. Follow these instructions at home:  When labor starts, or if your water breaks, call your health care provider or nurse care line. Based on your situation, they will determine when you should go in for an exam.  During early labor, you may be able  to rest and manage symptoms at home. Some strategies to try at home include: ? Breathing and relaxation techniques. ? Taking a warm bath or shower. ? Listening to music. ? Using a heating pad on the lower back for pain. If you are directed to use heat:  Place a towel between your skin and the heat source.  Leave the heat on for 20-30 minutes.  Remove the heat if your skin turns bright red. This is especially important if you are unable to feel pain, heat, or cold. You may have a greater risk of getting burned.   Contact a health care provider if:  Your labor has started.  Your water breaks. Get help right away if:  You have painful, regular contractions that are 5 minutes apart or less.  Labor starts before you are [redacted] weeks along in your pregnancy.  You have a fever.  You have bright red blood coming from your vagina.  You do not feel your baby moving.  You have a severe headache with or without vision problems.  You have severe nausea, vomiting, or diarrhea.  You have chest pain or shortness of breath. These symptoms may represent a serious problem that is an emergency. Do not wait to see if the symptoms will go away. Get medical help right away. Call your local emergency services (911 in the [place] Do not drive yourself to the hospital. Summary  Labor is your body's natural process of moving your baby and the placenta out of your uterus.  The process of labor usually starts when your baby is full-term, between 37 and 40 weeks of pregnancy.  When labor starts, or if your water breaks, call your health care provider or nurse care line. Based on your situation, they will determine when you should go in for an exam. This information is not intended to replace advice given to you by your health care provider. Make sure you discuss any questions you have with your health care provider. Document Revised: 05/03/2020 Document Reviewed: 05/03/2020 Elsevier Patient Education  2021  [employer]        

## 2022-04-17 IMAGING — US US MFM OB FOLLOW-UP
1 series · 14 of 28 positions shown · non-contrast
Comparison: none

[Series 1: us mfm ob follow-up · 89 acquisitions, 14 frames shown]
[im 4/89]
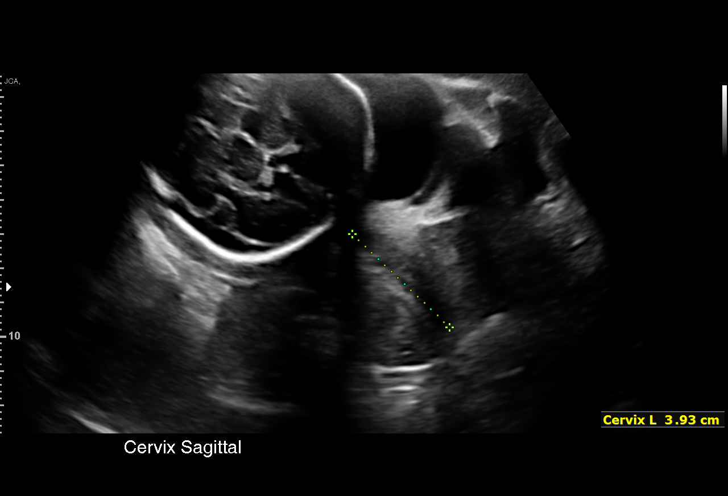
[im 10/89]
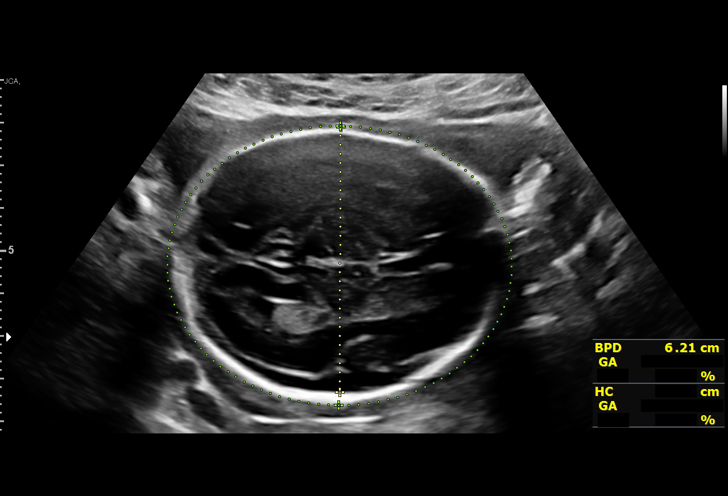
[im 17/89]
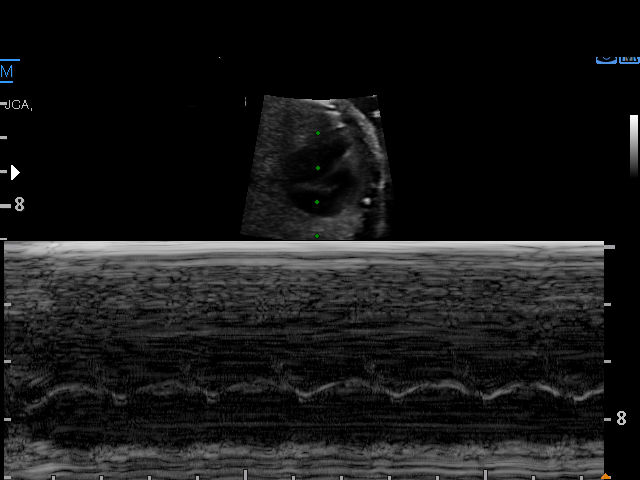
[im 23/89]
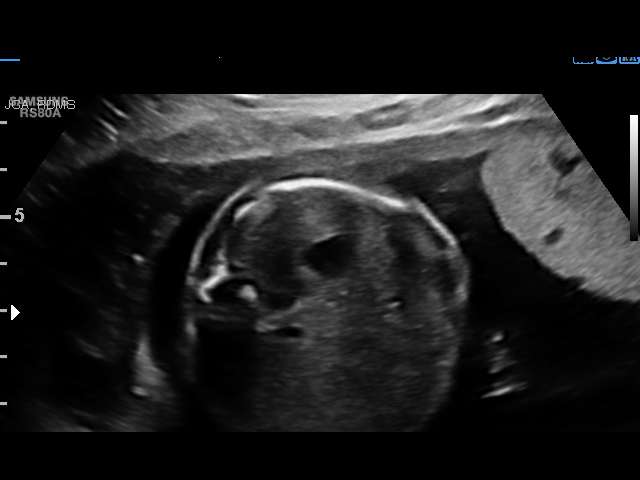
[im 30/89]
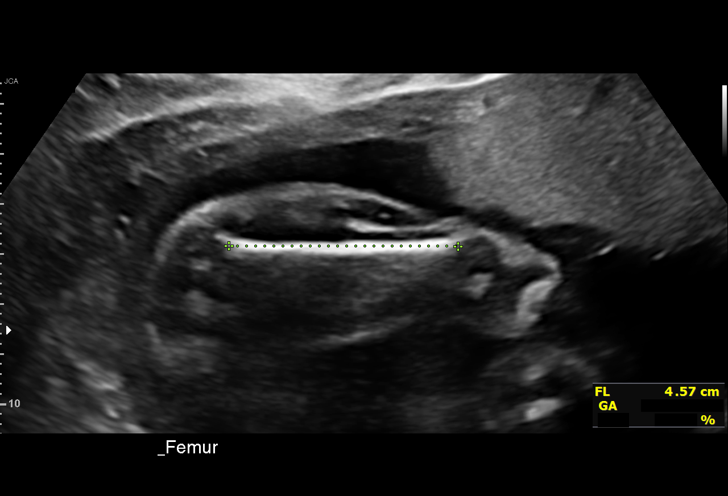
[im 36/89]
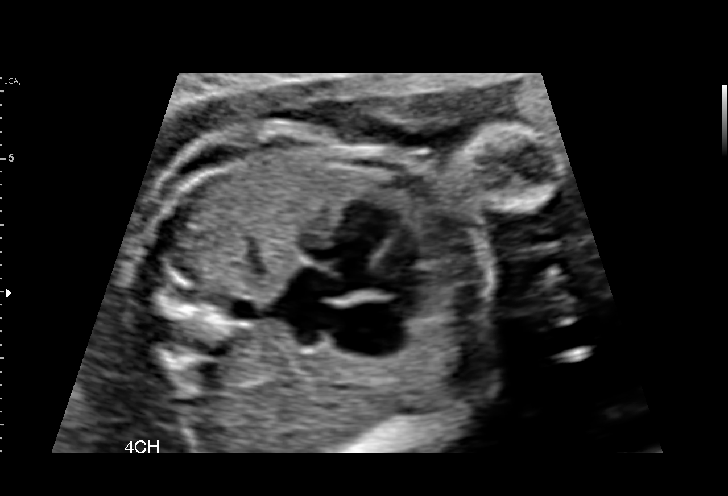
[im 43/89]
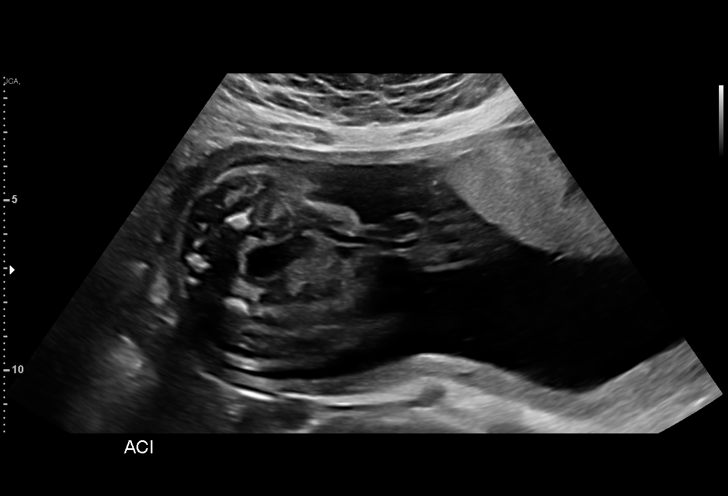
[im 49/89]
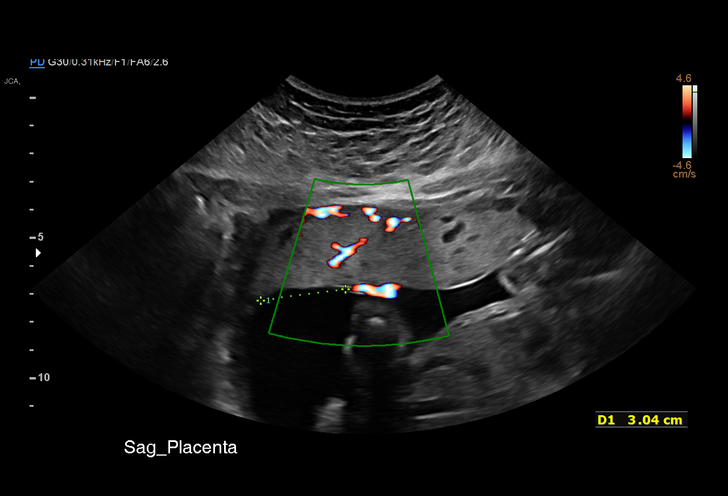
[im 56/89]
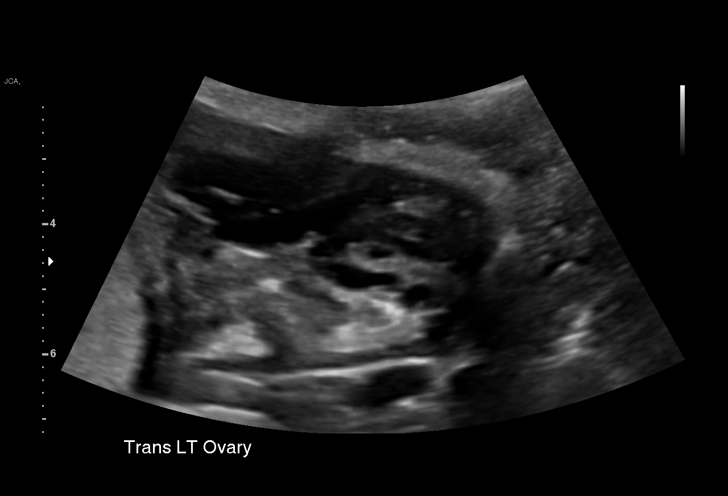
[im 62/89]
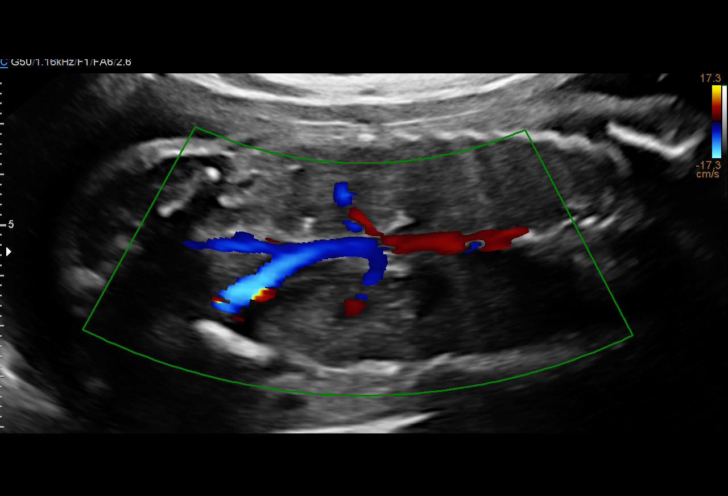
[im 69/89]
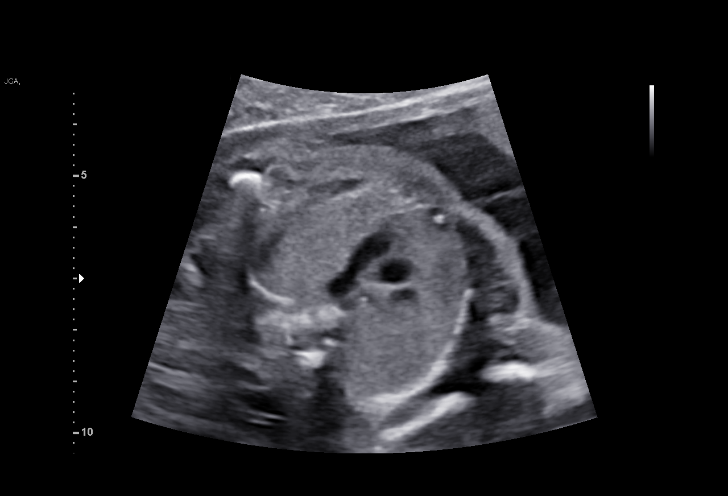
[im 75/89]
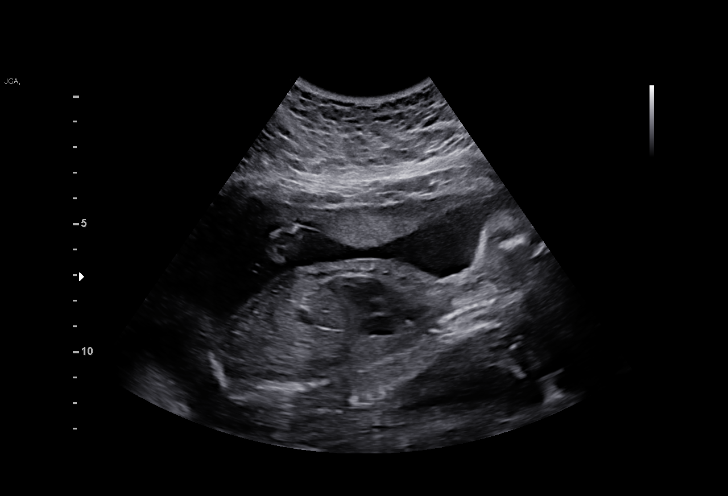
[im 82/89]
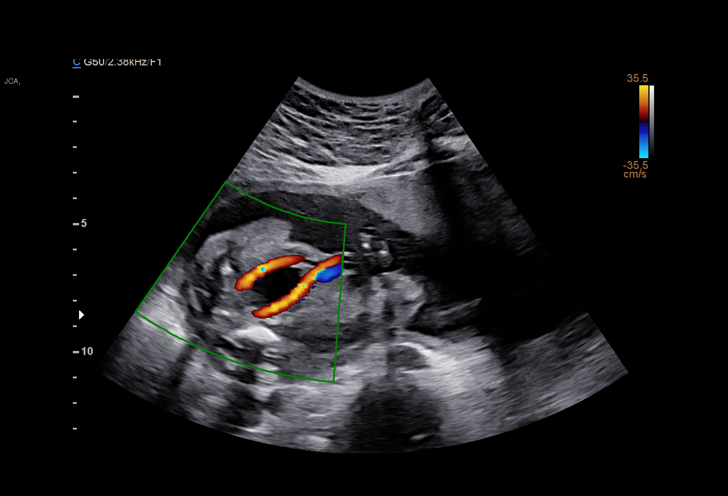
[im 89/89]
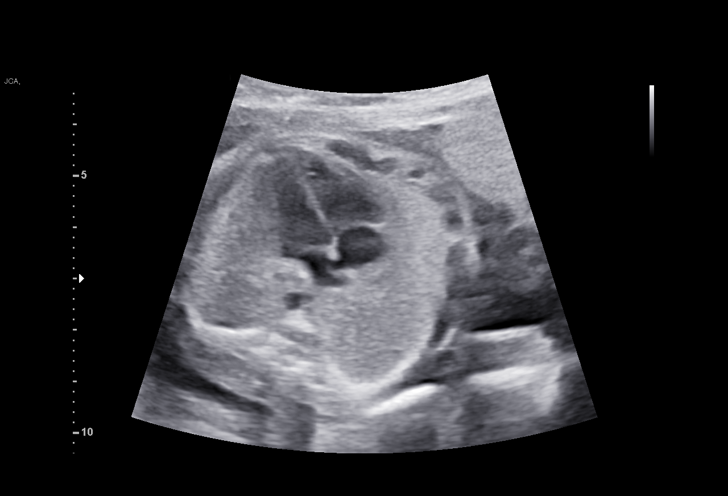

[14 of 28 positions shown; findings below may reference images not displayed]

PALAU

Indications

 Antenatal follow-up for nonvisualized fetal
 anatomy
 LR NIPS   Neg Horizon  Neg AFP
 23 weeks gestation of pregnancy
Fetal Evaluation

 Num Of Fetuses:         1
 Fetal Heart Rate(bpm):  151
 Cardiac Activity:       Observed
 Presentation:           Cephalic
 Placenta:               Anterior Left
 P. Cord Insertion:      Visualized

 Amniotic Fluid
 AFI FV:      Within normal limits

                             Largest Pocket(cm)

Biometry

 BPD:      62.4  mm     G. Age:  25w 2d         89  %    CI:        72.61   %    70 - 86
                                                         FL/HC:      19.5   %    18.7 -
 HC:      232.9  mm     G. Age:  25w 2d         84  %    HC/AC:      1.14        1.05 -
 AC:      204.5  mm     G. Age:  25w 0d         78  %    FL/BPD:     72.9   %    71 - 87
 FL:       45.5  mm     G. Age:  25w 0d         76  %    FL/AC:      22.2   %    20 - 24
 HUM:      41.1  mm     G. Age:  25w 0d         66  %
 LV:        5.2  mm

 Est. FW:     769  gm    1 lb 11 oz      92  %
OB History

 Gravidity:    1         Term:   0        Prem:   0        SAB:   0
 TOP:          0       Ectopic:  0        Living: 0
Gestational Age

 LMP:           23w 6d        Date:  01/19/20                 EDD:   10/25/20
 U/S Today:     25w 1d                                        EDD:   10/16/20
 Best:          23w 6d     Det. By:  LMP  (01/19/20)          EDD:   10/25/20
Anatomy

 Cranium:               Appears normal         Aortic Arch:            Previously seen
 Cavum:                 Appears normal         Ductal Arch:            Appears normal
 Ventricles:            Appears normal         Diaphragm:              Appears normal
 Choroid Plexus:        Previously seen        Stomach:                Appears normal, left
                                                                       sided
 Cerebellum:            Previously seen        Abdomen:                Previously seen
 Posterior Fossa:       Previously seen        Abdominal Wall:         Previously seen
 Nuchal Fold:           Not applicable (>20    Cord Vessels:           Previously seen
                        wks GA)
 Face:                  Profile appears        Kidneys:                Appear normal
                        normal
 Lips:                  Appears normal         Bladder:                Appears normal
 Thoracic:              Appears normal         Spine:                  Previously seen
 Heart:                 Appears normal         Upper Extremities:      Previously seen
                        (4CH, axis, and
                        situs)
 RVOT:                  Previously seen        Lower Extremities:      Previously seen
 LVOT:                  Previously seen

 Other:  Fetus appears to be a male. Heels and 5th digit prev visualized. Open
         hands prev visualized. Nasal bone prev visualized.
Cervix Uterus Adnexa

 Cervix
 Length:           3.74  cm.
 Normal appearance by transabdominal scan. Closed

 Right Ovary
 Within normal limits.

 Left Ovary
 Within normal limits.
Comments

 This patient was seen for a follow up exam as the fetal
 cardiac views were unable to be fully visualized during her
 prior exam.  She denies any problems since her last exam.
 She was informed that the fetal growth and amniotic fluid
 level appears appropriate for her gestational age.
 The views of the fetal heart were visualized today.  There
 were no obvious anomalies suspected.  The limitations of
 ultrasound in the detection of all anomalies was discussed.
 Follow-up as indicated.
